# Patient Record
Sex: Male | Born: 1935
Health system: Southern US, Community
[De-identification: ages and names within clinical notes are randomized; demographics above are authoritative.]

## PROBLEM LIST (undated history)

## (undated) DIAGNOSIS — G4733 Obstructive sleep apnea (adult) (pediatric): Secondary | ICD-10-CM

## (undated) DIAGNOSIS — M792 Neuralgia and neuritis, unspecified: Secondary | ICD-10-CM

## (undated) DIAGNOSIS — K529 Noninfective gastroenteritis and colitis, unspecified: Secondary | ICD-10-CM

## (undated) DIAGNOSIS — G500 Trigeminal neuralgia: Secondary | ICD-10-CM

## (undated) DIAGNOSIS — N200 Calculus of kidney: Secondary | ICD-10-CM

## (undated) DIAGNOSIS — H409 Unspecified glaucoma: Secondary | ICD-10-CM

## (undated) DIAGNOSIS — M797 Fibromyalgia: Secondary | ICD-10-CM

## (undated) DIAGNOSIS — E785 Hyperlipidemia, unspecified: Secondary | ICD-10-CM

## (undated) DIAGNOSIS — Z9989 Dependence on other enabling machines and devices: Secondary | ICD-10-CM

## (undated) HISTORY — PX: HAND SURGERY: SHX662

## (undated) HISTORY — PX: OTHER SURGICAL HISTORY: SHX169

## (undated) HISTORY — DX: Hyperlipidemia, unspecified: E78.5

## (undated) HISTORY — DX: Unspecified glaucoma: H40.9

## (undated) HISTORY — PX: COLONOSCOPY: SHX174

## (undated) HISTORY — DX: Trigeminal neuralgia: G50.0

## (undated) HISTORY — DX: Neuralgia and neuritis, unspecified: M79.2

## (undated) HISTORY — DX: Dependence on other enabling machines and devices: Z99.89

## (undated) HISTORY — DX: Noninfective gastroenteritis and colitis, unspecified: K52.9

## (undated) HISTORY — DX: Fibromyalgia: M79.7

## (undated) HISTORY — DX: Calculus of kidney: N20.0

## (undated) HISTORY — DX: Obstructive sleep apnea (adult) (pediatric): G47.33

---

## 2005-06-17 ENCOUNTER — Ambulatory Visit: Payer: Self-pay | Admitting: Internal Medicine

## 2008-02-17 ENCOUNTER — Ambulatory Visit (HOSPITAL_COMMUNITY): Admission: RE | Admit: 2008-02-17 | Discharge: 2008-02-17 | Payer: Self-pay | Admitting: Urology

## 2008-04-02 ENCOUNTER — Ambulatory Visit: Admission: RE | Admit: 2008-04-02 | Discharge: 2008-04-02 | Payer: Self-pay | Admitting: Neurology

## 2011-01-03 NOTE — Procedures (Signed)
NAME:  JACLYN, CAREW               ACCOUNT NO.:  [id number]   MEDICAL RECORD NO.:  [id number]          PATIENT TYPE:  OUT   LOCATION:  SLEE                          FACILITY:  [facility]   PHYSICIAN:  Kofi A. Gerilyn Pilgrim, M.D. DATE OF BIRTH:  Nov 03, 1932   DATE OF PROCEDURE:  DATE OF DISCHARGE:  04/02/2008                             SLEEP DISORDER REPORT   POLYSOMNOGRAPHY REPORT   REFERRING PHYSICIAN:  Kofi A. Gerilyn Pilgrim, M.D.   INDICATION:  A 77 year old male who presents with snoring and insomnia.  He has been evaluated for obstructive sleep apnea syndrome.   MEDICATIONS:  Quinapril, Neurontin.   Epworth sleepiness scale 6.  BMI 24.   SLEEP STAGE SUMMARY:  The total recording time is 464 minutes.  Sleep  efficiency 66%.  Sleep latency 8.5 minutes.  REM latency 62 minutes.  Stage N1 9%, N2 62%, N3 15%, and the REM sleep 19.5%.   RESPIRATORY SUMMARY:  The baseline oxygen saturation is 99 with the  lowest oxygen saturation 94%, pHi 3.1.   LIMB MOVEMENT SUMMARY:  PLM index 3.7.   ELECTROCARDIOGRAM SUMMARY:  Average heart rate of 56 with no significant  dysrhythmias observed.   IMPRESSION:  Unremarkable nocturnal polysomnography.      Kofi A. Gerilyn Pilgrim, M.D.  Electronically Signed     KAD/MEDQ  D:  04/05/2008  T:  04/06/2008  Job:  [id number]

## 2014-04-11 ENCOUNTER — Ambulatory Visit: Payer: Self-pay | Admitting: Neurology

## 2014-04-19 ENCOUNTER — Telehealth: Payer: Self-pay | Admitting: Neurology

## 2014-04-19 NOTE — Telephone Encounter (Signed)
Pt cancelled appt and referring dr notified

## 2015-03-27 ENCOUNTER — Encounter: Payer: Self-pay | Admitting: *Deleted

## 2015-03-28 ENCOUNTER — Encounter: Payer: Self-pay | Admitting: Neurology

## 2015-03-28 ENCOUNTER — Ambulatory Visit (INDEPENDENT_AMBULATORY_CARE_PROVIDER_SITE_OTHER): Payer: Medicare Other | Admitting: Neurology

## 2015-03-28 VITALS — BP 149/82 | HR 66 | Ht 69.0 in | Wt 144.0 lb

## 2015-03-28 DIAGNOSIS — H547 Unspecified visual loss: Secondary | ICD-10-CM

## 2015-03-28 DIAGNOSIS — G500 Trigeminal neuralgia: Secondary | ICD-10-CM | POA: Diagnosis not present

## 2015-03-28 MED ORDER — OXCARBAZEPINE 150 MG PO TABS: ORAL_TABLET | ORAL | Status: DC

## 2015-03-28 NOTE — Progress Notes (Signed)
PATIENT: Richard Santiago DOB: 12-18-1932  Chief Complaint  Patient presents with  . Trigeminal Neuralgia    He is here with his wife, Richard Santiago.  He has been having his right-sided facial pain for at least ten years.  Says he has tried numberous medications and has underwent gamma knife.  Currenty, he is using ibuprofen and naproxen for pain relief.     HISTORICAL  Richard Santiago is a 81 years old right-handed male, accompanied by his wife, seen in refer by his primary care physician Dr. Sherril Croon,  I reviewed and summarized most recent office visit from Dr. 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 Gentry Roch, he had a complex ocular issues, exudative detachment and development of RAPD at some point between Nov 2014 and Mar 2015. Per prior notes the patient had a chorioretinitis, but no evidence of intraocular inflammation, exam showed superior choroidal effusion adjacent to subretinal fluid inferiorly.  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, was put on treatment for tuberculosis with 4 drug regimen, on repeat ophthalmology examination, he does great response to anti-TB treatment, with significant less serous retinal detachment, he has poor vision at baseline  He began to have right facial shooting pain since 2004, has been evaluated and treated by different neurologists in the past, develop side effect with different agent, gabapentin works some, but caused drowsiness, Lyrica, increased confusion, Vimpat, dizziness, carbamazepine, causing rash, for a while, he has to rely on narcotics, Lortab, hydrocodone  He was eventually referred to his first Gamma knife surgery by [facility] neurosurgeon Dr.  Brooke Pace in 2007, He responded very well, the benefit lasts for a couple years  He then began to have recurrent right facial pain again, with spreading of the territory, not only involving his right temporal frontal area, also began to involving right cheek, right upper and lower teeth, he had second, knife surgery by Dr. Brooke Pace in February 20 first 2013, with only short lasting benefit,  Over the past 2 years, he had variable degree of right facial pain, getting worse since 2015, 20% time of the day he experienced moderate to severe shooting pain, " like a hot iron poker to his face", triggered by cold, acidic food he had a significant weight loss, over the past 3 months, he has been taken Aleve 200 mg tablets 2 tablets every 4-5 hours, up to 10-12 tablets each day,  he is not on any neuropathic pain medications. Reported a history of rash with carbamazepine treatment in the past  REVIEW OF SYSTEMS: Full 14 system review of systems performed and notable only for constipation, loss of vision, snoring,   ALLERGIES: Allergies  Allergen Reactions  . Gabapentin Other (See Comments)    Fatigue, confusion  . Lyrica [Pregabalin] Other (See Comments)    Fatigue  . Quinapril Swelling    Fatigue  . Ciprofloxacin Rash  . Tegretol [Carbamazepine] Rash    HOME MEDICATIONS: Current Outpatient Prescriptions  Medication Sig Dispense Refill  . amitriptyline (ELAVIL) 10 MG tablet Take 10 mg by mouth at bedtime.    Marland Kitchen amLODipine (NORVASC) 10 MG tablet Take 10 mg by mouth daily. Take in addition to 10mg  tab - total 15mg  daily.    Marland Kitchen  amLODipine (NORVASC) 5 MG tablet Take 5 mg by mouth daily. Take in addition to 10mg  tablet - total 15mg  daily.    Marland Kitchen gabapentin (NEURONTIN) 600 MG tablet Take 600 mg by mouth. Take one tab three times daily and two tablets at bedtime.    Marland Kitchen latanoprost (XALATAN) 0.005 % ophthalmic solution daily.    . Multiple Vitamin (MULTIVITAMIN) tablet Take 1 tablet by mouth daily.     . naproxen sodium (ANAPROX) 220 MG tablet Take 220 mg by mouth 3 (three) times daily as needed.    . nystatin (MYCOSTATIN) 100000 UNIT/ML suspension Take by mouth 3 (three) times daily.    . timolol (TIMOPTIC) 0.5 % ophthalmic solution daily.     No current facility-administered medications for this visit.    PAST MEDICAL HISTORY: Past Medical History  Diagnosis Date  . Gastroenteritis   . OSA on CPAP   . Neuralgia   . Kidney stone   . Hyperlipemia   . Fibromyalgia   . Trigeminal neuralgia   . Glaucoma     PAST SURGICAL HISTORY: Past Surgical History  Procedure Laterality Date  . Hand surgery Right   . Colonoscopy    . Gamma knife      Dr. Angelyn Punt x 2    FAMILY HISTORY: Family History  Problem Relation Age of Onset  . Cancer Mother   . Heart attack Father     SOCIAL HISTORY:  Social History   Social History  . Marital Status: Married    Spouse Name: N/A  . Number of Children: 5  . Years of Education: 9   Occupational History  . Retired    Social History Main Topics  . Smoking status: Never Smoker   . Smokeless tobacco: Not on file  . Alcohol Use: No  . Drug Use: No  . Sexual Activity: Not on file   Other Topics Concern  . Not on file   Social History Narrative   Lives at home with his wife, Richard Santiago.    Right-handed.   No caffeine use.     PHYSICAL EXAM   Filed Vitals:   03/28/15 1431  BP: 149/82  Pulse: 66  Height: 5\' 9"  (1.753 m)  Weight: 144 lb (65.318 kg)    Not recorded      Body mass index is 21.26 kg/(m^2).  PHYSICAL EXAMNIATION:  Gen: NAD, conversant, well nourised, obese, well groomed                     Cardiovascular: Regular rate rhythm, no peripheral edema, warm, nontender. Eyes: Conjunctivae clear without exudates or hemorrhage Neck: Supple, no carotid bruise. Pulmonary: Clear to auscultation bilaterally   NEUROLOGICAL EXAM:  MENTAL STATUS: Speech:    Speech is normal; fluent and spontaneous with normal  comprehension.  Cognition:     Orientation to time, place and person     Normal recent and remote memory     Normal Attention span and concentration     Normal Language, naming, repeating,spontaneous speech     Fund of knowledge   CRANIAL NERVES: CN II: Visual fields are full to confrontation. Fundoscopic exam is normal with sharp discs and no vascular changes. Postsurgical change of irregular pupil bilaterally,. Visual acuity 20/400 bilaterally CN III, IV, VI: extraocular movement are normal. No ptosis. CN V: Facial sensation is intact to pinprick in all 3 divisions bilaterally. Corneal responses are intact.  CN VII: Face is symmetric with normal eye closure and smile. CN VIII:  Hearing is normal to rubbing fingers CN IX, X: Palate elevates symmetrically. Phonation is normal. CN XI: Head turning and shoulder shrug are intact CN XII: Tongue is midline with normal movements and no atrophy.  MOTOR: There is no pronator drift of out-stretched arms. Muscle bulk and tone are normal. Muscle strength is normal.  REFLEXES: Reflexes are 2+ and symmetric at the biceps, triceps, knees, and ankles. Plantar responses are flexor.  SENSORY: Intact to light touch, pinprick, position sense, and vibration sense are intact in fingers and toes, with exception of decreased light touch at right face V1, V2, V3 distribution.  COORDINATION: Rapid alternating movements and fine finger movements are intact. There is no dysmetria on finger-to-nose and heel-knee-shin.    GAIT/STANCE: Cautious, mildly unsteady Romberg is absent.  DIAGNOSTIC DATA (LABS, IMAGING, TESTING) - I reviewed patient records, labs, notes, testing and imaging myself where available.   ASSESSMENT AND PLAN  Richard Santiago is a 81 y.o. male   Recurrent right trigeminal neuralgia  Failed, knife surgery twice, most recent was February 2013  Could not tolerate Lyrica, Vimpat, less optimal control with gabapentin, could not tolerate  higher dose with side effect, allergic reaction to carbamazepine, rash,  Start low-dose Trileptal 150 mg half tablets twice a day, potential side effect including rash was explained to the patient, he is to call my office for rash  MRI of the brain with and without contrast History of ocular tuberculosis    Levert Feinstein, M.D. Ph.D.  [facility] [address], [address] [place], [place] [address] Ph: [phone] Fax: [phone]  CC: To  Dr. Sherril Croon,

## 2015-04-09 ENCOUNTER — Other Ambulatory Visit: Payer: Self-pay | Admitting: *Deleted

## 2015-04-09 ENCOUNTER — Other Ambulatory Visit: Payer: Self-pay | Admitting: Neurology

## 2015-04-09 DIAGNOSIS — G500 Trigeminal neuralgia: Secondary | ICD-10-CM

## 2015-04-10 ENCOUNTER — Ambulatory Visit
Admission: RE | Admit: 2015-04-10 | Discharge: 2015-04-10 | Disposition: A | Discharge status: Home / Self Care | Payer: PRIVATE HEALTH INSURANCE | Source: Ambulatory Visit | Attending: Neurology | Admitting: Neurology

## 2015-04-10 DIAGNOSIS — H547 Unspecified visual loss: Secondary | ICD-10-CM | POA: Diagnosis not present

## 2015-04-10 DIAGNOSIS — G500 Trigeminal neuralgia: Secondary | ICD-10-CM | POA: Diagnosis not present

## 2015-04-10 MED ORDER — GADOBENATE DIMEGLUMINE 529 MG/ML IV SOLN
7.0000 mL | Freq: Once | INTRAVENOUS | Status: AC | PRN
  Administered 2015-04-10: 6 mL via INTRAVENOUS

## 2015-04-12 ENCOUNTER — Telehealth: Payer: Self-pay | Admitting: *Deleted

## 2015-04-12 NOTE — Telephone Encounter (Signed)
-----   Message from Levert Feinstein, MD sent at 04/12/2015 12:33 PM EDT ----- Please call pt for no significant abnormality on MRI brain.

## 2015-04-12 NOTE — Telephone Encounter (Signed)
Spoke to patient - aware of results. 

## 2015-05-02 ENCOUNTER — Encounter: Payer: Self-pay | Admitting: Neurology

## 2015-05-02 ENCOUNTER — Ambulatory Visit (INDEPENDENT_AMBULATORY_CARE_PROVIDER_SITE_OTHER): Payer: Medicare Other | Admitting: Neurology

## 2015-05-02 VITALS — BP 125/73 | HR 58 | Ht 69.0 in | Wt 147.0 lb

## 2015-05-02 DIAGNOSIS — G500 Trigeminal neuralgia: Secondary | ICD-10-CM | POA: Diagnosis not present

## 2015-05-02 MED ORDER — NORTRIPTYLINE HCL 10 MG PO CAPS: ORAL_CAPSULE | ORAL | Status: DC

## 2015-05-02 MED ORDER — GABAPENTIN 600 MG PO TABS: 600.0000 mg | ORAL_TABLET | Freq: Three times a day (TID) | ORAL | Status: DC

## 2015-05-02 NOTE — Progress Notes (Signed)
Chief Complaint  Patient presents with  . Trigeminal Neuralgia    He is here with his wife, Richard Santiago. He has had some improvement in pain with Trileptal but he noticed a rash appear a few days after he started taking the medication.  . Vision difficulty    Says he has good and bad days with his vision difficulty. He was only able to read at 20/100 with his glasses today.      PATIENT: Richard Santiago DOB: [birth year]  Chief Complaint  Patient presents with  . Trigeminal Neuralgia    He is here with his wife, Richard Santiago. He has had some improvement in pain with Trileptal but he noticed a rash appear a few days after he started taking the medication.  . Vision difficulty    Says he has good and bad days with his vision difficulty. He was only able to read at 20/100 with his glasses today.     HISTORICAL  Richard Santiago is a 81 years old right-handed male, accompanied by his wife, seen in refer by his primary care physician Dr. Sherril Croon,  I reviewed and summarized most recent office visit from Dr. 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 Gentry Roch, he had a complex ocular issues, exudative detachment and development of RAPD at some point between Nov 2014 and Mar 2015. Per prior notes the patient had a chorioretinitis, but no evidence of intraocular inflammation, exam showed superior choroidal effusion adjacent to subretinal fluid inferiorly.  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, was put on treatment for tuberculosis with 4 drug regimen, on repeat ophthalmology examination, he does great response to anti-TB treatment, with significant less serous retinal detachment, he has poor vision at  baseline  He began to have right facial shooting pain since 2004, has been evaluated and treated by different neurologists in the past, develop side effect with different agent, gabapentin works some, but caused drowsiness, Lyrica, increased confusion, Vimpat, dizziness, carbamazepine, causing rash, for a while, he has to rely on narcotics, Lortab, hydrocodone  He was eventually referred to his first Gamma knife surgery by [facility] neurosurgeon Dr. Brooke Pace in 2007, He responded very well, the benefit lasts for a couple years  He then began to have recurrent right facial pain again, expanding of involved pain territory, not only involving his right temporal frontal area, also began to involving right cheek, right upper and lower teeth, he had second Gamma knife surgery by Dr. Brooke Pace in February 20 first 2013, with only short lasting benefit,  Over the past 2 years, he had variable degree of right facial pain, getting worse since 2015, 20% time of the day he experienced moderate to severe shooting pain, " like a hot iron poker to his face", triggered by cold, acidic food he had a significant weight loss, over the past 3 months, he has been taken Aleve 200 mg tablets 2 tablets every 4-5 hours, up to 10-12 tablets each day,  he is not on any neuropathic pain medications. Reported a history of rash with carbamazepine treatment in the past  UPDATE May 02 2015: He has stopped his gabapentin since last visit, he was taking up to 2400 mg daily, complains of  dizziness, sometimes sleep walking, Trileptal 150 mg half tablets twice a day was helpful especially at the beginning of the medication, but unfortunately, he has developed rash, he did not call office to report the rash, continue taking half tablets twice a day,  He has intermittent right lower jaw pain, difficulty eating, drinking,   I have personally reviewed MRI of brain August 2016:There are no acute findings. The trigeminal nerves appear  normal without any evidence of compression.  CSF space is generous around the frontal and parietal lobes. This could be idiopathic or due to subdural hygromas.  REVIEW OF SYSTEMS: Full 14 system review of systems performed and notable only for constipation, loss of vision, snoring,   ALLERGIES: Allergies  Allergen Reactions  . Gabapentin Other (See Comments)    Fatigue, confusion  . Lyrica [Pregabalin] Other (See Comments)    Fatigue  . Quinapril Swelling    Fatigue  . Ciprofloxacin Rash  . Tegretol [Carbamazepine] Rash    HOME MEDICATIONS: Current Outpatient Prescriptions  Medication Sig Dispense Refill  . amitriptyline (ELAVIL) 10 MG tablet Take 10 mg by mouth at bedtime.    Marland Kitchen amLODipine (NORVASC) 10 MG tablet Take 10 mg by mouth daily. Take in addition to  tab - total  daily.    Marland Kitchen amLODipine (NORVASC) 5 MG tablet Take 5 mg by mouth daily. Take in addition to  tablet - total  daily.    Marland Kitchen gabapentin (NEURONTIN) 600 MG tablet Take 600 mg by mouth. Take one tab three times daily and two tablets at bedtime.    Marland Kitchen latanoprost (XALATAN) 0.005 % ophthalmic solution daily.    . Multiple Vitamin (MULTIVITAMIN) tablet Take 1 tablet by mouth daily.    . naproxen sodium (ANAPROX) 220 MG tablet Take 220 mg by mouth 3 (three) times daily as needed.    . nystatin (MYCOSTATIN) 100000 UNIT/ML suspension Take by mouth 3 (three) times daily.    . OXcarbazepine (TRILEPTAL) 150 MG tablet 1/2 tab po bid 60 tablet 6  . timolol (TIMOPTIC) 0.5 % ophthalmic solution daily.     No current facility-administered medications for this visit.    PAST MEDICAL HISTORY: Past Medical History  Diagnosis Date  . Gastroenteritis   . OSA on CPAP   . Neuralgia   . Kidney stone   . Hyperlipemia   . Fibromyalgia   . Trigeminal neuralgia   . Glaucoma     PAST SURGICAL HISTORY: Past Surgical History  Procedure Laterality Date  . Hand surgery Right   . Colonoscopy    . Gamma knife      Dr.  Angelyn Punt x 2    FAMILY HISTORY: Family History  Problem Relation Age of Onset  . Cancer Mother   . Heart attack Father     SOCIAL HISTORY:  Social History   Social History  . Marital Status: Married    Spouse Name: N/A  . Number of Children: 5  . Years of Education: 9   Occupational History  . Retired    Social History Main Topics  . Smoking status: Never Smoker   . Smokeless tobacco: Not on file  . Alcohol Use: No  . Drug Use: No  . Sexual Activity: Not on file   Other Topics Concern  . Not on file   Social History Narrative   Lives at home with his wife, Richard Santiago.    Right-handed.   No caffeine use.     PHYSICAL EXAM   Filed Vitals:  05/02/15 1212  BP: 125/73  Pulse: 58  Height: 5\' 9"  (1.753 m)  Weight: 147 lb (66.679 kg)    Not recorded      Body mass index is 21.7 kg/(m^2).  PHYSICAL EXAMNIATION:  Gen: NAD, conversant, well nourised, obese, well groomed                     Cardiovascular: Regular rate rhythm, no peripheral edema, warm, nontender. Eyes: Conjunctivae clear without exudates or hemorrhage Neck: Supple, no carotid bruise. Pulmonary: Clear to auscultation bilaterally   NEUROLOGICAL EXAM:  MENTAL STATUS: Speech:    Speech is normal; fluent and spontaneous with normal comprehension.  Cognition:     Orientation to time, place and person     Normal recent and remote memory     Normal Attention span and concentration     Normal Language, naming, repeating,spontaneous speech     Fund of knowledge   CRANIAL NERVES: CN II: Visual fields are full to confrontation. Fundoscopic exam is normal with sharp discs and no vascular changes. Postsurgical change of irregular pupil bilaterally,. Visual acuity 20/400 bilaterally CN III, IV, VI: extraocular movement are normal. No ptosis. CN V: Facial sensation is intact to pinprick in all 3 divisions bilaterally. Corneal responses are intact.  CN VII: Face is symmetric with normal eye closure and  smile. CN VIII: Hearing is normal to rubbing fingers CN IX, X: Palate elevates symmetrically. Phonation is normal. CN XI: Head turning and shoulder shrug are intact CN XII: Tongue is midline with normal movements and no atrophy.  MOTOR: There is no pronator drift of out-stretched arms. Muscle bulk and tone are normal. Muscle strength is normal.  REFLEXES: Reflexes are 2+ and symmetric at the biceps, triceps, knees, and ankles. Plantar responses are flexor.  SENSORY: Intact to light touch, pinprick, position sense, and vibration sense are intact in fingers and toes, with exception of decreased light touch at right face V1, V2, V3 distribution.  COORDINATION: Rapid alternating movements and fine finger movements are intact. There is no dysmetria on finger-to-nose and heel-knee-shin.    GAIT/STANCE: Cautious, mildly unsteady Romberg is absent.  DIAGNOSTIC DATA (LABS, IMAGING, TESTING) - I reviewed patient records, labs, notes, testing and imaging myself where available.   ASSESSMENT AND PLAN  Richard Santiago is a 81 y.o. male   Recurrent right trigeminal neuralgia  Failed gamma knife surgery twice, most recent was February 2013  Could not tolerate Lyrica, Vimpat, less optimal control with gabapentin, higher dose caused side effect, such as sleepwalking, dizziness,   allergic reaction to Tegretol, Trileptal  We will restart gabapentin 600 mg tablet, gradually titrating up to 2400 mg daily  Nortriptyline 10 mg, titrating to 20 mg every night   History of ocular tuberculosis    Richard Santiago, M.D. Ph.D.  [facility] [address], [address] [place], [place] [address] Ph: [phone] Fax: [phone]  CC: To  Dr. Sherril Croon,

## 2015-05-23 ENCOUNTER — Telehealth: Payer: Self-pay | Admitting: *Deleted

## 2015-05-23 ENCOUNTER — Ambulatory Visit: Payer: Medicare Other | Admitting: Neurology

## 2015-05-23 NOTE — Telephone Encounter (Signed)
No showed follow up appt. 

## 2015-05-24 ENCOUNTER — Encounter: Payer: Self-pay | Admitting: Neurology

## 2015-06-11 ENCOUNTER — Ambulatory Visit (INDEPENDENT_AMBULATORY_CARE_PROVIDER_SITE_OTHER): Payer: Medicare Other | Admitting: Neurology

## 2015-06-11 ENCOUNTER — Encounter: Payer: Self-pay | Admitting: Neurology

## 2015-06-11 VITALS — BP 108/64 | HR 63 | Ht 69.0 in | Wt 148.0 lb

## 2015-06-11 DIAGNOSIS — G500 Trigeminal neuralgia: Secondary | ICD-10-CM

## 2015-06-11 MED ORDER — FENTANYL 25 MCG/HR TD PT72: 25.0000 ug | MEDICATED_PATCH | TRANSDERMAL | Status: DC

## 2015-06-11 NOTE — Progress Notes (Signed)
Chief Complaint  Patient presents with  . Trigeminal Neuralgia    He is here with his wife, Richard Santiago. His intermittent right-sided facial pain has worsened.  He is currently taking nortriptyline  qhs and gabapentin , one capsule four times daily.  His wife feels the gabapentin is making him too sleepy and confused.      PATIENT: Richard Santiago DOB: 03-08-1933  Chief Complaint  Patient presents with  . Trigeminal Neuralgia    He is here with his wife, Richard Santiago. His intermittent right-sided facial pain has worsened.  He is currently taking nortriptyline  qhs and gabapentin , one capsule four times daily.  His wife feels the gabapentin is making him too sleepy and confused.     HISTORICAL  Richard Santiago is a 82 years old right-handed male, accompanied by his wife, seen in refer by his primary care physician Dr. Sherril Croon,  I reviewed and summarized most recent office visit from Dr. 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 Gentry Roch, he had a complex ocular issues, exudative detachment and development of RAPD at some point between Nov 2014 and Mar 2015. Per prior notes the patient had a chorioretinitis, but no evidence of intraocular inflammation, exam showed superior choroidal effusion adjacent to subretinal fluid inferiorly.  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, was put on treatment for tuberculosis with 4 drug regimen, on repeat ophthalmology examination, he does great response to anti-TB treatment, with significant less serous retinal detachment, he has poor vision at baseline  He began to have right facial shooting pain since 2004, has been evaluated and treated by different  neurologists in the past, develop side effect with different agent, gabapentin works some, but caused drowsiness, Lyrica, increased confusion, Vimpat, dizziness, carbamazepine, trileptal causing rash, for a while, he has to rely on narcotics, Lortab, hydrocodone  He was eventually referred to his first Gamma knife surgery by [facility] neurosurgeon Dr. Brooke Pace in 2007, He responded very well, the benefit lasts for a couple years  He then began to have recurrent right facial pain again, expanding of involved pain territory, not only involving his right temporal frontal area, also began to involving right cheek, right upper and lower teeth, he had second Gamma knife surgery by Dr. Brooke Pace in February 20 first 2013, with only short lasting benefit,  Over the past 2 years, he had variable degree of right facial pain, getting worse since 2015, 20% time of the day he experienced moderate to severe shooting pain, " like a hot iron poker to his face", triggered by cold, acidic food he had a significant weight loss, over the past 3 months, he has been taken Aleve 200 mg tablets 2 tablets every 4-5 hours, up to 10-12 tablets each day,  he is not on any neuropathic pain medications. Reported a history of rash with carbamazepine treatment in the past  UPDATE May 02 2015: He has stopped his gabapentin since last visit, he was taking up to 2400 mg daily, complains of dizziness, sometimes sleep walking, Trileptal 150 mg half tablets twice a day was helpful especially at the beginning of the medication, but unfortunately, he has developed rash, he did not call office to report the  rash, continue taking half tablets twice a day,  He has intermittent right lower jaw pain, difficulty eating, drinking,   I have personally reviewed MRI of brain August 2016:There are no acute findings. The trigeminal nerves appear normal without any evidence of compression.  CSF space is generous around the frontal and parietal  lobes. This could be idiopathic or due to subdural hygromas.  UPDATE Jun 11 2015: He continue have moderate to severe right trigeminal pain, involving right lower jaw, radiating to the torsed right temporal region, he has been taking gabapentin 600 mg 4 times a day, complains of dizziness, sleepiness with higher dose, suboptimal control with current gabapentin, he was able to tolerate nortriptyline 20 mg every night, seems to help him sleep better, no significant side effect.  REVIEW OF SYSTEMS: Full 14 system review of systems performed and notable only for constipation, loss of vision, snoring,   ALLERGIES: Allergies  Allergen Reactions  . Gabapentin Other (See Comments)    Fatigue, confusion  . Lyrica [Pregabalin] Other (See Comments)    Fatigue  . Quinapril Swelling    Fatigue  . Ciprofloxacin Rash  . Tegretol [Carbamazepine] Rash  . Trileptal [Oxcarbazepine] Rash    HOME MEDICATIONS: Current Outpatient Prescriptions  Medication Sig Dispense Refill  . amLODipine (NORVASC) 10 MG tablet Take 10 mg by mouth daily. Take in addition to  tab - total  daily.    Marland Kitchen amLODipine (NORVASC) 5 MG tablet Take 5 mg by mouth daily. Take in addition to  tablet - total  daily.    Marland Kitchen gabapentin (NEURONTIN) 600 MG tablet Take 1 tablet (600 mg total) by mouth 3 (three) times daily. Take one tab three times daily and two tablets at bedtime. 150 tablet 11  . latanoprost (XALATAN) 0.005 % ophthalmic solution daily.    . Multiple Vitamin (MULTIVITAMIN) tablet Take 1 tablet by mouth daily.    . naproxen sodium (ANAPROX) 220 MG tablet Take 220 mg by mouth 3 (three) times daily as needed.    . nortriptyline (PAMELOR) 10 MG capsule One po qhs xone week, then 2 tabs po qhs 60 capsule 6  . nystatin (MYCOSTATIN) 100000 UNIT/ML suspension Take by mouth 3 (three) times daily.    . timolol (TIMOPTIC) 0.5 % ophthalmic solution daily.     No current facility-administered medications for this visit.     PAST MEDICAL HISTORY: Past Medical History  Diagnosis Date  . Gastroenteritis   . OSA on CPAP   . Neuralgia   . Kidney stone   . Hyperlipemia   . Fibromyalgia   . Trigeminal neuralgia   . Glaucoma     PAST SURGICAL HISTORY: Past Surgical History  Procedure Laterality Date  . Hand surgery Right   . Colonoscopy    . Gamma knife      Dr. Angelyn Punt x 2    FAMILY HISTORY: Family History  Problem Relation Age of Onset  . Cancer Mother   . Heart attack Father     SOCIAL HISTORY:  Social History   Social History  . Marital Status: Married    Spouse Name: N/A  . Number of Children: 5  . Years of Education: 9   Occupational History  . Retired    Social History Main Topics  . Smoking status: Never Smoker   . Smokeless tobacco: Not on file  . Alcohol Use: No  . Drug Use: No  . Sexual Activity: Not on file   Other Topics Concern  . Not  on file   Social History Narrative   Lives at home with his wife, Richard AasDoris.    Right-handed.   No caffeine use.     PHYSICAL EXAM   Filed Vitals:   06/11/15 1602  BP: 108/64  Pulse: 63  Height: 5\' 9"  (1.753 m)  Weight: 148 lb (67.132 kg)    Not recorded      Body mass index is 21.85 kg/(m^2).  PHYSICAL EXAMNIATION:  Gen: NAD, conversant, well nourised, obese, well groomed                     Cardiovascular: Regular rate rhythm, no peripheral edema, warm, nontender. Eyes: Conjunctivae clear without exudates or hemorrhage Neck: Supple, no carotid bruise. Pulmonary: Clear to auscultation bilaterally   NEUROLOGICAL EXAM:  MENTAL STATUS: Speech:    Speech is normal; fluent and spontaneous with normal comprehension.  Cognition:     Orientation to time, place and person     Normal recent and remote memory     Normal Attention span and concentration     Normal Language, naming, repeating,spontaneous speech     Fund of knowledge   CRANIAL NERVES: CN II: Visual fields are full to confrontation. Fundoscopic exam is  normal with sharp discs and no vascular changes. Postsurgical change of irregular pupil bilaterally,. Visual acuity 20/400 bilaterally CN III, IV, VI: extraocular movement are normal. No ptosis. CN V: Facial sensation is intact to pinprick in all 3 divisions bilaterally. Corneal responses are intact.  CN VII: Face is symmetric with normal eye closure and smile. CN VIII: Hearing is normal to rubbing fingers CN IX, X: Palate elevates symmetrically. Phonation is normal. CN XI: Head turning and shoulder shrug are intact CN XII: Tongue is midline with normal movements and no atrophy.  MOTOR: There is no pronator drift of out-stretched arms. Muscle bulk and tone are normal. Muscle strength is normal.  REFLEXES: Reflexes are 2+ and symmetric at the biceps, triceps, knees, and ankles. Plantar responses are flexor.  SENSORY: Intact to light touch, pinprick, position sense, and vibration sense are intact in fingers and toes, with exception of decreased light touch at right face V1, V2, V3 distribution.  COORDINATION: Rapid alternating movements and fine finger movements are intact. There is no dysmetria on finger-to-nose and heel-knee-shin.    GAIT/STANCE: Cautious, mildly unsteady Romberg is absent.  DIAGNOSTIC DATA (LABS, IMAGING, TESTING) - I reviewed patient records, labs, notes, testing and imaging myself where available.   ASSESSMENT AND PLAN  Lannette Donathlvin E Bialy is a 81 y.o. male   Recurrent right trigeminal neuralgia  Failed gamma knife surgery twice, most recent was February 2013  Could not tolerate Lyrica, Vimpat, less optimal control with gabapentin, higher dose caused side effect, such as sleepwalking, dizziness, is currently taking 600  mg 4 times a day  allergic reaction to Tegretol, Trileptal  We will restart gabapentin 600 mg tablet, gradually titrating up to 2400 mg daily  Keep Nortriptyline 20 mg every night  Add on fentanyl patch 25 g every 3 days   History of ocular  tuberculosis    Levert FeinsteinYijun Jawaun Celmer, M.D. Ph.D.  [facility] [address], [address] [place], [place] [address] Ph: [phone] Fax: [phone]  CC: To  Dr. Sherril CroonVyas,

## 2015-07-30 ENCOUNTER — Ambulatory Visit (INDEPENDENT_AMBULATORY_CARE_PROVIDER_SITE_OTHER): Payer: Medicare Other | Admitting: Neurology

## 2015-07-30 ENCOUNTER — Encounter: Payer: Self-pay | Admitting: Neurology

## 2015-07-30 VITALS — BP 150/79 | HR 71 | Ht 69.0 in | Wt 141.0 lb

## 2015-07-30 DIAGNOSIS — G500 Trigeminal neuralgia: Secondary | ICD-10-CM | POA: Diagnosis not present

## 2015-07-30 MED ORDER — GABAPENTIN 600 MG PO TABS: 600.0000 mg | ORAL_TABLET | Freq: Every day | ORAL | Status: AC

## 2015-07-30 NOTE — Progress Notes (Signed)
Chief Complaint  Patient presents with  . Trigeminal Neuralgia    He is here with wife, Richard AasDoris and daughter, Richard Santiago. His pain is much worse despite taking all his medications as prescribed.      PATIENT: Richard Santiago DOB: [birth year]  Chief Complaint  Patient presents with  . Trigeminal Neuralgia    He is here with wife, Richard AasDoris and daughter, Richard Santiago. His pain is much worse despite taking all his medications as prescribed.     HISTORICAL  Richard Santiago is a 81 years old right-handed male, accompanied by his wife, seen in refer by his primary care physician Dr. Sherril CroonVyas,  I reviewed and summarized most recent office visit from Dr. 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 Gentry Rochimothy Martin, he had a complex ocular issues, exudative detachment and development of RAPD at some point between Nov 2014 and Mar 2015. Per prior notes the patient had a chorioretinitis, but no evidence of intraocular inflammation, exam showed superior choroidal effusion adjacent to subretinal fluid inferiorly.  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, was put on treatment for tuberculosis with 4 drug regimen, on repeat ophthalmology examination, he does great response to anti-TB treatment, with significant less serous retinal detachment, he has poor vision at baseline  He began to have right facial shooting pain since 2004, has been evaluated and treated by different neurologists in the past, develop side effect with different agent, gabapentin works some, but caused drowsiness, Lyrica, increased confusion, Vimpat, dizziness, carbamazepine, trileptal causing rash, for a while, he has to rely on narcotics, Lortab,  hydrocodone  He was eventually referred to his [facility] Gamma [facility] Dr. Brooke PaceStephen Tatter in 2007, He responded very well, the benefit lasts for a couple years  He then began to have recurrent right facial pain again, expanding of involved pain territory, not only involving his right temporal frontal area, also began to involving right cheek, right upper and lower teeth, he had second Gamma knife surgery by Dr. Brooke PaceStephen Tatter in February 20 first 2013, with only short lasting benefit,  Over the past 2 years, he had variable degree of right facial pain, getting worse since 2015, 20% time of the day he experienced moderate to severe shooting pain, " like a hot iron poker to his face", triggered by cold, acidic food he had a significant weight loss, over the past 3 months, he has been taken Aleve 200 mg tablets 2 tablets every 4-5 hours, up to 10-12 tablets each day,  he is not on any neuropathic pain medications. Reported a history of rash with carbamazepine treatment in the past  UPDATE May 02 2015: He has stopped his gabapentin since last visit, he was taking up to 2400 mg daily, complains of dizziness, sometimes sleep walking, Trileptal 150 mg half tablets twice a day was helpful especially at the beginning of the medication, but unfortunately, he has developed rash, he did not call office to report the rash, continue taking half tablets twice a day,  He has intermittent right lower jaw pain, difficulty eating, drinking,   I have personally reviewed MRI of brain August 2016:There are no acute findings. The trigeminal nerves appear normal without any evidence of  compression.  CSF space is generous around the frontal and parietal lobes. This could be idiopathic or due to subdural hygromas.  UPDATE Jun 11 2015: He continue have moderate to severe right trigeminal pain, involving right lower jaw, radiating to the torsed right temporal region, he has been taking gabapentin 600  mg 4 times a day, complains of dizziness, sleepiness with higher dose, suboptimal control with current gabapentin, he was able to tolerate nortriptyline 20 mg every night, seems to help him sleep better, no significant side effect.  UPDATE Jul 30 2015:  He has tried fentanyl patch 25 g, along with gabapentin 600 mg 4 times a day, nortriptyline 20 mg every night, did not help his symptoms, previously has tried Percocet could not tolerate it due to side effect, he has frequent severe right facial pain mainly involving right V2 branches   REVIEW OF SYSTEMS: Full 14 system review of systems performed and notable only for constipation, loss of vision, snoring,   ALLERGIES: Allergies  Allergen Reactions  . Gabapentin Other (See Comments)    Fatigue, confusion  . Lyrica [Pregabalin] Other (See Comments)    Fatigue  . Quinapril Swelling    Fatigue  . Ciprofloxacin Rash  . Tegretol [Carbamazepine] Rash  . Trileptal [Oxcarbazepine] Rash    HOME MEDICATIONS: Current Outpatient Prescriptions  Medication Sig Dispense Refill  . amLODipine (NORVASC) 10 MG tablet Take 10 mg by mouth daily. Take in addition to  tab - total  daily.    Marland Kitchen amLODipine (NORVASC) 5 MG tablet Take 5 mg by mouth daily. Take in addition to  tablet - total  daily.    . fentaNYL (DURAGESIC) 25 MCG/HR patch Place 1 patch (25 mcg total) onto the skin every 3 (three) days. 11 patch 0  . gabapentin (NEURONTIN) 600 MG tablet Take 1 tablet (600 mg total) by mouth 3 (three) times daily. Take one tab three times daily and two tablets at bedtime. 150 tablet 11  . latanoprost (XALATAN) 0.005 % ophthalmic solution daily.    . Multiple Vitamin (MULTIVITAMIN) tablet Take 1 tablet by mouth daily.    . naproxen sodium (ANAPROX) 220 MG tablet Take 220 mg by mouth 3 (three) times daily as needed.    . nortriptyline (PAMELOR) 10 MG capsule One po qhs xone week, then 2 tabs po qhs 60 capsule 6  . nystatin (MYCOSTATIN) 100000  UNIT/ML suspension Take by mouth 3 (three) times daily.    . timolol (TIMOPTIC) 0.5 % ophthalmic solution daily.     No current facility-administered medications for this visit.    PAST MEDICAL HISTORY: Past Medical History  Diagnosis Date  . Gastroenteritis   . OSA on CPAP   . Neuralgia   . Kidney stone   . Hyperlipemia   . Fibromyalgia   . Trigeminal neuralgia   . Glaucoma     PAST SURGICAL HISTORY: Past Surgical History  Procedure Laterality Date  . Hand surgery Right   . Colonoscopy    . Gamma knife      Dr. Angelyn Punt x 2    FAMILY HISTORY: Family History  Problem Relation Age of Onset  . Cancer Mother   . Heart attack Father     SOCIAL HISTORY:  Social History   Social History  . Marital Status: Married    Spouse Name: N/A  . Number of Children: 5  . Years of Education: 9   Occupational History  . Retired    Social History Main Topics  .  Smoking status: Never Smoker   . Smokeless tobacco: Not on file  . Alcohol Use: No  . Drug Use: No  . Sexual Activity: Not on file   Other Topics Concern  . Not on file   Social History Narrative   Lives at home with his wife, Richard Santiago.    Right-handed.   No caffeine use.     PHYSICAL EXAM   Filed Vitals:   07/30/15 1657  BP: 150/79  Pulse: 71  Height:  (1.753 m)  Weight: 141 lb (63.957 kg)    Not recorded      Body mass index is 20.81 kg/(m^2).  PHYSICAL EXAMNIATION:  Gen: NAD, conversant, well nourised, obese, well groomed                     Cardiovascular: Regular rate rhythm, no peripheral edema, warm, nontender. Eyes: Conjunctivae clear without exudates or hemorrhage Neck: Supple, no carotid bruise. Pulmonary: Clear to auscultation bilaterally   NEUROLOGICAL EXAM:  MENTAL STATUS: Speech:    Speech is normal; fluent and spontaneous with normal comprehension.  Cognition:     Orientation to time, place and person     Normal recent and remote memory     Normal Attention span and  concentration     Normal Language, naming, repeating,spontaneous speech     Fund of knowledge   CRANIAL NERVES: CN II: Visual fields are full to confrontation. Fundoscopic exam is normal with sharp discs and no vascular changes. Postsurgical change of irregular pupil bilaterally,. Visual acuity 20/400 bilaterally CN III, IV, VI: extraocular movement are normal. No ptosis. CN V: Facial sensation is intact to pinprick in all 3 divisions bilaterally. Corneal responses are intact.  CN VII: Face is symmetric with normal eye closure and smile. CN VIII: Hearing is normal to rubbing fingers CN IX, X: Palate elevates symmetrically. Phonation is normal. CN XI: Head turning and shoulder shrug are intact CN XII: Tongue is midline with normal movements and no atrophy.  MOTOR: There is no pronator drift of out-stretched arms. Muscle bulk and tone are normal. Muscle strength is normal.  REFLEXES: Reflexes are 2+ and symmetric at the biceps, triceps, knees, and ankles. Plantar responses are flexor.  SENSORY: Intact to light touch, pinprick, position sense, and vibration sense are intact in fingers and toes, with exception of decreased light touch at right face V1, V2, V3 distribution.  COORDINATION: Rapid alternating movements and fine finger movements are intact. There is no dysmetria on finger-to-nose and heel-knee-shin.    GAIT/STANCE: Cautious, mildly unsteady Romberg is absent.  DIAGNOSTIC DATA (LABS, IMAGING, TESTING) - I reviewed patient records, labs, notes, testing and imaging myself where available.   ASSESSMENT AND PLAN  Richard Santiago is a 81 y.o. male   Recurrent right trigeminal neuralgia  Failed gamma knife surgery twice, most recent was February 2013  Could not tolerate Lyrica, Vimpat, less optimal control with gabapentin, higher dose caused side effect, such as sleepwalking, dizziness, could not tolerate ox oxycodone in the past due to side effect , could not tolerate  fentanyl patch  allergic reaction to Tegretol, Trileptal  We continue gabapentin 600 mg tablet, titrating up to 3000 mg daily   Refer his to neurosurgeon Dr. Brooke Pace  History of ocular tuberculosis    Levert Feinstein, M.D. Ph.D.  [facility] [address] [place], [place] [address] Ph: [phone] Fax: [phone]  CC: To  Dr. Sherril Croon,

## 2015-10-14 ENCOUNTER — Encounter (HOSPITAL_COMMUNITY): Payer: Self-pay | Admitting: *Deleted

## 2015-10-14 ENCOUNTER — Emergency Department (HOSPITAL_COMMUNITY): Payer: Medicare PPO

## 2015-10-14 ENCOUNTER — Inpatient Hospital Stay (HOSPITAL_COMMUNITY)
Admission: EM | Admit: 2015-10-14 | Discharge: 2015-10-19 | DRG: 872 | Disposition: A | Discharge status: Home Health | Payer: Medicare PPO | Attending: Family Medicine | Admitting: Family Medicine

## 2015-10-14 DIAGNOSIS — Z79899 Other long term (current) drug therapy: Secondary | ICD-10-CM

## 2015-10-14 DIAGNOSIS — Z809 Family history of malignant neoplasm, unspecified: Secondary | ICD-10-CM | POA: Diagnosis not present

## 2015-10-14 DIAGNOSIS — I129 Hypertensive chronic kidney disease with stage 1 through stage 4 chronic kidney disease, or unspecified chronic kidney disease: Secondary | ICD-10-CM | POA: Diagnosis present

## 2015-10-14 DIAGNOSIS — N189 Chronic kidney disease, unspecified: Secondary | ICD-10-CM | POA: Diagnosis present

## 2015-10-14 DIAGNOSIS — R319 Hematuria, unspecified: Secondary | ICD-10-CM | POA: Insufficient documentation

## 2015-10-14 DIAGNOSIS — E785 Hyperlipidemia, unspecified: Secondary | ICD-10-CM | POA: Diagnosis present

## 2015-10-14 DIAGNOSIS — N10 Acute tubulo-interstitial nephritis: Secondary | ICD-10-CM

## 2015-10-14 DIAGNOSIS — G500 Trigeminal neuralgia: Secondary | ICD-10-CM | POA: Diagnosis present

## 2015-10-14 DIAGNOSIS — M797 Fibromyalgia: Secondary | ICD-10-CM | POA: Diagnosis present

## 2015-10-14 DIAGNOSIS — N179 Acute kidney failure, unspecified: Secondary | ICD-10-CM | POA: Diagnosis not present

## 2015-10-14 DIAGNOSIS — A419 Sepsis, unspecified organism: Principal | ICD-10-CM | POA: Diagnosis present

## 2015-10-14 DIAGNOSIS — A1853 Tuberculous chorioretinitis: Secondary | ICD-10-CM | POA: Diagnosis present

## 2015-10-14 DIAGNOSIS — Z8249 Family history of ischemic heart disease and other diseases of the circulatory system: Secondary | ICD-10-CM | POA: Diagnosis not present

## 2015-10-14 DIAGNOSIS — N136 Pyonephrosis: Secondary | ICD-10-CM | POA: Diagnosis present

## 2015-10-14 DIAGNOSIS — H409 Unspecified glaucoma: Secondary | ICD-10-CM | POA: Diagnosis present

## 2015-10-14 DIAGNOSIS — B9620 Unspecified Escherichia coli [E. coli] as the cause of diseases classified elsewhere: Secondary | ICD-10-CM | POA: Diagnosis present

## 2015-10-14 DIAGNOSIS — A1850 Tuberculosis of eye, unspecified: Secondary | ICD-10-CM | POA: Diagnosis present

## 2015-10-14 DIAGNOSIS — Z87442 Personal history of urinary calculi: Secondary | ICD-10-CM | POA: Diagnosis not present

## 2015-10-14 DIAGNOSIS — A4151 Sepsis due to Escherichia coli [E. coli]: Secondary | ICD-10-CM | POA: Diagnosis not present

## 2015-10-14 DIAGNOSIS — N1330 Unspecified hydronephrosis: Secondary | ICD-10-CM | POA: Diagnosis not present

## 2015-10-14 DIAGNOSIS — N400 Enlarged prostate without lower urinary tract symptoms: Secondary | ICD-10-CM | POA: Diagnosis present

## 2015-10-14 DIAGNOSIS — R509 Fever, unspecified: Secondary | ICD-10-CM | POA: Diagnosis not present

## 2015-10-14 DIAGNOSIS — G4733 Obstructive sleep apnea (adult) (pediatric): Secondary | ICD-10-CM | POA: Diagnosis present

## 2015-10-14 DIAGNOSIS — N12 Tubulo-interstitial nephritis, not specified as acute or chronic: Secondary | ICD-10-CM | POA: Diagnosis present

## 2015-10-14 LAB — URINALYSIS, ROUTINE W REFLEX MICROSCOPIC
BILIRUBIN URINE: NEGATIVE
Glucose, UA: NEGATIVE mg/dL
Ketones, ur: NEGATIVE mg/dL
NITRITE: NEGATIVE
Protein, ur: 100 mg/dL — AB
SPECIFIC GRAVITY, URINE: 1.02 (ref 1.005–1.030)
pH: 6 (ref 5.0–8.0)

## 2015-10-14 LAB — COMPREHENSIVE METABOLIC PANEL
ALBUMIN: 3.2 g/dL — AB (ref 3.5–5.0)
ALK PHOS: 91 U/L (ref 38–126)
ALT: 36 U/L (ref 17–63)
ANION GAP: 8 (ref 5–15)
AST: 37 U/L (ref 15–41)
BUN: 26 mg/dL — ABNORMAL HIGH (ref 6–20)
CALCIUM: 8.5 mg/dL — AB (ref 8.9–10.3)
CHLORIDE: 103 mmol/L (ref 101–111)
CO2: 28 mmol/L (ref 22–32)
Creatinine, Ser: 1.72 mg/dL — ABNORMAL HIGH (ref 0.61–1.24)
GFR calc non Af Amer: 36 mL/min — ABNORMAL LOW (ref >60)
GFR, EST AFRICAN AMERICAN: 41 mL/min — AB (ref >60)
GLUCOSE: 128 mg/dL — AB (ref 65–99)
POTASSIUM: 3.8 mmol/L (ref 3.5–5.1)
SODIUM: 139 mmol/L (ref 135–145)
Total Bilirubin: 1.5 mg/dL — ABNORMAL HIGH (ref 0.3–1.2)
Total Protein: 6.5 g/dL (ref 6.5–8.1)

## 2015-10-14 LAB — CBC WITH DIFFERENTIAL/PLATELET
BASOS PCT: 0 %
Basophils Absolute: 0 10*3/uL (ref 0.0–0.1)
EOS ABS: 0 10*3/uL (ref 0.0–0.7)
EOS PCT: 0 %
HCT: 32.3 % — ABNORMAL LOW (ref 39.0–52.0)
HEMOGLOBIN: 10.6 g/dL — AB (ref 13.0–17.0)
LYMPHS ABS: 0.5 10*3/uL — AB (ref 0.7–4.0)
Lymphocytes Relative: 6 %
MCH: 29.2 pg (ref 26.0–34.0)
MCHC: 32.8 g/dL (ref 30.0–36.0)
MCV: 89 fL (ref 78.0–100.0)
MONOS PCT: 13 %
Monocytes Absolute: 1 10*3/uL (ref 0.1–1.0)
NEUTROS PCT: 81 %
Neutro Abs: 6.2 10*3/uL (ref 1.7–7.7)
PLATELETS: 160 10*3/uL (ref 150–400)
RBC: 3.63 MIL/uL — ABNORMAL LOW (ref 4.22–5.81)
RDW: 12.1 % (ref 11.5–15.5)
WBC: 7.7 10*3/uL (ref 4.0–10.5)

## 2015-10-14 LAB — URINE MICROSCOPIC-ADD ON: SQUAMOUS EPITHELIAL / LPF: NONE SEEN

## 2015-10-14 LAB — POC OCCULT BLOOD, ED: FECAL OCCULT BLD: NEGATIVE

## 2015-10-14 LAB — INFLUENZA PANEL BY PCR (TYPE A & B)
H1N1FLUPCR: NOT DETECTED
INFLBPCR: NEGATIVE
Influenza A By PCR: NEGATIVE

## 2015-10-14 LAB — I-STAT CG4 LACTIC ACID, ED: Lactic Acid, Venous: 0.8 mmol/L (ref 0.5–2.0)

## 2015-10-14 LAB — TSH: TSH: 2.336 u[IU]/mL (ref 0.350–4.500)

## 2015-10-14 MED ORDER — DEXTROSE 5 % IV SOLN
1.0000 g | Freq: Once | INTRAVENOUS | Status: AC
  Administered 2015-10-14: 1 g via INTRAVENOUS
  Filled 2015-10-14: qty 10

## 2015-10-14 MED ORDER — ACETAMINOPHEN 325 MG PO TABS
650.0000 mg | ORAL_TABLET | Freq: Once | ORAL | Status: AC
Start: 2015-10-14 — End: 2015-10-14
  Administered 2015-10-14: 650 mg via ORAL
  Filled 2015-10-14: qty 2

## 2015-10-14 MED ORDER — DEXTROSE 5 % IV SOLN
1.0000 g | INTRAVENOUS | Status: DC
  Filled 2015-10-14 (×2): qty 10

## 2015-10-14 MED ORDER — SODIUM CHLORIDE 0.9% FLUSH
3.0000 mL | Freq: Two times a day (BID) | INTRAVENOUS | Status: DC
  Administered 2015-10-14 – 2015-10-19 (×5): 3 mL via INTRAVENOUS

## 2015-10-14 MED ORDER — LATANOPROST 0.005 % OP SOLN
1.0000 [drp] | Freq: Every day | OPHTHALMIC | Status: DC
  Administered 2015-10-16 – 2015-10-18 (×3): 1 [drp] via OPHTHALMIC
  Filled 2015-10-14: qty 2.5

## 2015-10-14 MED ORDER — ACETAMINOPHEN 325 MG PO TABS
650.0000 mg | ORAL_TABLET | Freq: Four times a day (QID) | ORAL | Status: DC | PRN
  Administered 2015-10-14 – 2015-10-17 (×6): 650 mg via ORAL
  Filled 2015-10-14 (×7): qty 2

## 2015-10-14 MED ORDER — SODIUM CHLORIDE 0.9 % IV BOLUS (SEPSIS)
1000.0000 mL | Freq: Once | INTRAVENOUS | Status: AC
  Administered 2015-10-14: 1000 mL via INTRAVENOUS

## 2015-10-14 MED ORDER — DEXTROSE-NACL 5-0.9 % IV SOLN
INTRAVENOUS | Status: DC
  Administered 2015-10-14 – 2015-10-15 (×2): via INTRAVENOUS

## 2015-10-14 MED ORDER — ACETAMINOPHEN 650 MG RE SUPP: 650.0000 mg | Freq: Four times a day (QID) | RECTAL | Status: DC | PRN

## 2015-10-14 MED ORDER — DEXTROSE 5 % IV SOLN: 1.0000 g | Freq: Once | INTRAVENOUS | Status: DC

## 2015-10-14 MED ORDER — HEPARIN SODIUM (PORCINE) 5000 UNIT/ML IJ SOLN
5000.0000 [IU] | Freq: Three times a day (TID) | INTRAMUSCULAR | Status: DC
  Administered 2015-10-15 – 2015-10-19 (×14): 5000 [IU] via SUBCUTANEOUS
  Filled 2015-10-14 (×14): qty 1

## 2015-10-14 MED ORDER — TIMOLOL MALEATE 0.5 % OP SOLN
1.0000 [drp] | Freq: Every day | OPHTHALMIC | Status: DC
  Administered 2015-10-16 – 2015-10-19 (×4): 1 [drp] via OPHTHALMIC
  Filled 2015-10-14 (×2): qty 5

## 2015-10-14 MED ORDER — LORAZEPAM 0.5 MG PO TABS
0.5000 mg | ORAL_TABLET | Freq: Two times a day (BID) | ORAL | Status: DC
  Administered 2015-10-15 – 2015-10-19 (×9): 0.5 mg via ORAL
  Filled 2015-10-14 (×9): qty 1

## 2015-10-14 NOTE — ED Notes (Signed)
Pts wife states pt was shaking last night and this morning, possibly having chills. Wife states that pts urine is very strong with increased urinary frequency.

## 2015-10-14 NOTE — ED Notes (Signed)
Rancour informed of 103.7 rectal temp

## 2015-10-14 NOTE — ED Notes (Signed)
MD at bedside. 

## 2015-10-14 NOTE — ED Notes (Signed)
Pt tolerated ambulation well.

## 2015-10-14 NOTE — ED Provider Notes (Signed)
CSN: [id number]     Arrival date & time 10/14/15  1150 History  By signing my name below, I, Richard Santiago, attest that this documentation has been prepared under the direction and in the presence of [name], MD. Electronically Signed: [name], Scribe. 10/14/2015. 12:37 PM.   Chief Complaint  Patient presents with  . Urinary Frequency   The history is provided by the patient and the spouse. No language interpreter was used.   HPI Comments: Level 5 Caveat due to dementia  Richard Santiago is a 82 y.o. male with a hx of trigeminal neuralgia, fibromyalgia, and glaucoma who presents to the Emergency Department complaining of sudden onset of a subjective fever and uncontrollable shaking yesterday. Pt's wife also notes associated emesis yesterday as well as loss of appetite, rhinorrhea, cough, foul smelling, dark urine, and frequent urination. Pt also experienced another episode of uncontrollable shaking this morning as well as onset of a fever. Pt denies SOB, HA, chest pain, abdominal pain, nausea, dysuria, and hematuria. He also denies hx of DM. Pt did not receive a flu shot this past year and has not currently taken any new medications. Pt's wife denies recent falls or injuries.   Past Medical History  Diagnosis Date  . Gastroenteritis   . OSA on CPAP   . Neuralgia   . Kidney stone   . Hyperlipemia   . Fibromyalgia   . Trigeminal neuralgia   . Glaucoma    Past Surgical History  Procedure Laterality Date  . Hand surgery Right   . Colonoscopy    . Gamma knife      Dr. Angelyn Punt x 2   Family History  Problem Relation Age of Onset  . Cancer Mother   . Heart attack Father    Social History  Substance Use Topics  . Smoking status: Never Smoker   . Smokeless tobacco: None  . Alcohol Use: No    Review of Systems A complete 10 system review of systems was obtained and all systems are negative except as noted in the HPI and PMH.   Allergies  Gabapentin;  Lyrica; Quinapril; Ciprofloxacin; Tegretol; and Trileptal  Home Medications   Prior to Admission medications   Medication Sig Start Date End Date Taking? Authorizing Provider  amLODipine (NORVASC) 10 MG tablet Take 10 mg by mouth daily. Take in addition to  tab - total  daily.   Yes Historical Provider, MD  amLODipine (NORVASC) 5 MG tablet Take 5 mg by mouth daily. Take in addition to  tablet - total  daily.   Yes Historical Provider, MD  gabapentin (NEURONTIN) 600 MG tablet Take 1 tablet (600 mg total) by mouth 5 (five) times daily. Take one tab three times daily and two tablets at bedtime. 07/30/15  Yes Levert Feinstein, MD  latanoprost (XALATAN) 0.005 % ophthalmic solution Place 1 drop into both eyes at bedtime.  03/13/15  Yes Historical Provider, MD  LORazepam (ATIVAN) 0.5 MG tablet Take 1 tablet by mouth 2 (two) times daily. 09/28/15  Yes Historical Provider, MD  Multiple Vitamin (MULTIVITAMIN) tablet Take 1 tablet by mouth daily.   Yes Historical Provider, MD  timolol (TIMOPTIC) 0.5 % ophthalmic solution Place 1 drop into both eyes daily.  03/13/15  Yes Historical Provider, MD  fentaNYL (DURAGESIC) 25 MCG/HR patch Place 1 patch (25 mcg total) onto the skin every 3 (three) days. Patient not taking: Reported on 10/14/2015 06/11/15   Levert Feinstein, MD   BP 137/88 mmHg  Pulse 93  Temp(Src) 100.4 F (38 C) (Core (Comment))  Resp 18  Ht 5\' 9"  (1.753 m)  Wt 154 lb (69.854 kg)  BMI 22.73 kg/m2  SpO2 97% Physical Exam  Constitutional: He appears well-developed and well-nourished. No distress.  Level 5 Caveat: Dementia. Oriented x2 Feels warm  HENT:  Head: Normocephalic and atraumatic.  Mouth/Throat: No oropharyngeal exudate.  Dry mucous membranes  Eyes: Conjunctivae and EOM are normal. Pupils are equal, round, and reactive to light.  Neck: Normal range of motion. Neck supple.  No meningismus.  Cardiovascular: Normal rate, regular rhythm, normal heart sounds and intact distal  pulses.   No murmur heard. Pulmonary/Chest: No respiratory distress.  Decreased breath sounds at the bases  Abdominal: Soft. There is no tenderness. There is no rebound and no guarding.  Musculoskeletal: Normal range of motion. He exhibits no edema or tenderness.  Neurological:   5/5 strength throughout. Moving all extremities. Unable to cooperate for finger to nose.  Skin: Skin is warm.  Psychiatric: He has a normal mood and affect. His behavior is normal.  Nursing note and vitals reviewed.   ED Course  Procedures  DIAGNOSTIC STUDIES:    Oxygen Saturation is 96% on RA, adequate by my interpretation.  COORDINATION OF CARE:  12:29 PM Will order x ray of chest and lab work.  Discussed treatment plan with pt at bedside and pt agreed to plan.   Labs Review Labs Reviewed  URINALYSIS, ROUTINE W REFLEX MICROSCOPIC (NOT AT [facility]) - Abnormal; Notable for the following:    APPearance HAZY (*)    Hgb urine dipstick MODERATE (*)    Protein, ur 100 (*)    Leukocytes, UA SMALL (*)    All other components within normal limits  CBC WITH DIFFERENTIAL/PLATELET - Abnormal; Notable for the following:    RBC 3.63 (*)    Hemoglobin 10.6 (*)    HCT 32.3 (*)    Lymphs Abs 0.5 (*)    All other components within normal limits  COMPREHENSIVE METABOLIC PANEL - Abnormal; Notable for the following:    Glucose, Bld 128 (*)    BUN 26 (*)    Creatinine, Ser 1.72 (*)    Calcium 8.5 (*)    Albumin 3.2 (*)    Total Bilirubin 1.5 (*)    GFR calc non Af Amer 36 (*)    GFR calc Af Amer 41 (*)    All other components within normal limits  URINE MICROSCOPIC-ADD ON - Abnormal; Notable for the following:    Bacteria, UA MANY (*)    All other components within normal limits  CULTURE, BLOOD (ROUTINE X 2)  CULTURE, BLOOD (ROUTINE X 2)  URINE CULTURE  RAPID STREP SCREEN (NOT AT [facility])  INFLUENZA PANEL BY PCR (TYPE A & B, H1N1)  POC OCCULT BLOOD, ED  I-STAT CG4 LACTIC ACID, ED  I-STAT CG4 LACTIC ACID, ED     Imaging Review Dg Chest 2 View  10/14/2015  CLINICAL DATA:  Fever, flu-like symptoms. EXAM: CHEST  2 VIEW COMPARISON:  06/07/2011 FINDINGS: Heart and mediastinal contours are within normal limits. No focal opacities or effusions. No acute bony abnormality. IMPRESSION: No active cardiopulmonary disease. Electronically Signed   By: Charlett Nose M.D.   On: 10/14/2015 13:49   Ct Renal Stone Study  10/14/2015  CLINICAL DATA:  Patient with hematuria. Fever and chills. History of renal stones. EXAM: CT ABDOMEN AND PELVIS WITHOUT CONTRAST TECHNIQUE: Multidetector CT imaging of the abdomen and pelvis was performed  following the standard protocol without IV contrast. COMPARISON:  None. FINDINGS: Lower chest: Normal heart size. There are consolidative opacities within the bilateral lower lobes. No pleural effusion. Hepatobiliary: Liver is normal in size and contour. Liver is diffusely low in attenuation. Gallbladder is unremarkable. Pancreas: Unremarkable Spleen: Unremarkable Adrenals/Urinary Tract: Mild bilateral adrenal thickening. The urinary bladder is markedly distended. There is mild bilateral hydroureteronephrosis. There is a 3.3 cm cyst off the inferior pole of the left kidney. There is a 1.7 cm cyst within the inferior pole of the right kidney. Extensive fat stranding about the right kidney and proximal right ureter. Mild stranding about the left kidney. Stomach/Bowel: No abnormal bowel wall thickening or evidence for bowel obstruction. No free intraperitoneal air. Small hiatal hernia. Vascular/Lymphatic: Normal caliber abdominal aorta. Peripheral calcified atherosclerotic plaque. No retroperitoneal lymphadenopathy. Other: Prostate enlarged. Musculoskeletal: Lumbar spine degenerative changes. No aggressive or acute appearing osseous lesions. IMPRESSION: There is mild bilateral hydronephrosis, left-greater-than-right. There is extensive fat stranding about the right kidney and proximal right ureter which is  nonspecific in etiology however may be secondary to pyelonephritis in the appropriate clinical setting. The urinary bladder is massively distended. Recommend decompression with Foley catheter. Electronically Signed   By: Annia Belt M.D.   On: 10/14/2015 17:13   I have personally reviewed and evaluated these images and lab results as part of my medical decision-making.  MDM   Final diagnoses:  Hematuria  Pyelonephritis   Patient from home with 2 day history of shaking chills and subjective fevers with foul-smelling urine. Confusion at baseline. Patient denies any pain.  Hemoglobin 10.8, no comparison.  Febrile to 103 on arrival. Cr 1.72.  IVF given. Flu swab negative.  lactate normal. No leukocytosis.   Patient at his mental baseline per family. He is tolerating by mouth and ambulatory. They would like him to go home if possible. Blood cultures and urine cultures pending. Suspect his fever and shaking chills are due to urinary tract infection.   CT obtained to evaluate for pyelonephritis. There is no kidney stone but there is extensive stranding of right kidney and proximal ureter with hydronephrosis. Also distended bladder. We'll place Foley catheter. Admission for UTI and pyelonephritis. D/w Dr. Conley Rolls   I personally performed the services described in this documentation, which was scribed in my presence. The recorded information has been reviewed and is accurate.   [name], MD 10/14/15 [phone]

## 2015-10-14 NOTE — ED Notes (Signed)
Foley temp reading 40.6, rectal temp checked 104.9. Dr. Nedra Hai informed. No orders at this time, states there should be parameters for when he arrives to floor

## 2015-10-14 NOTE — H&P (Signed)
[facility] History and Physical  Richard Santiago:[id number] DOB: 05-05-1933    PCP:   Ignatius Specking., MD   Chief Complaint: Chills.   HPI: Richard Santiago is an 82 y.o. male lives at home with his wife, hx of OSA on CPAP, nephrolithiasis, trigeminal neuralgia, presented to the ER with rigors and subjective fever, along with polyuria.  He denied sorethroat, stiff neck, coughs, abdominal pain.  Evaluation in the ER showed no leukocytosis, Cr of 1.7, Hb of 10.6 grams (not new-- Guiac negative), and UA showed 6-30 WBCs.  His abd/pelvic CT showed evidence of mild bilateral hydronephrosis, enlarged prostate, and no kidney stones.  BC and urine culture were obtained, and IV Rocephin was given.  He was not hypotensive and his lactic acid was normal.  Hospitalist was asked to admit him for pyelonephritis.    Rewiew of Systems:  Constitutional: Negative for malaise, fever and chills. No significant weight loss or weight gain Eyes: Negative for eye pain, redness and discharge, diplopia, visual changes, or flashes of light. ENMT: Negative for ear pain, hoarseness, nasal congestion, sinus pressure and sore throat. No headaches; tinnitus, drooling, or problem swallowing. Cardiovascular: Negative for chest pain, palpitations, diaphoresis, dyspnea and peripheral edema. ; No orthopnea, PND Respiratory: Negative for cough, hemoptysis, wheezing and stridor. No pleuritic chestpain. Gastrointestinal: Negative for nausea, vomiting, diarrhea, constipation, abdominal pain, melena, blood in stool, hematemesis, jaundice and rectal bleeding.    Genitourinary: Negative for frequency, dysuria, incontinence,flank pain and hematuria; Musculoskeletal: Negative for back pain and neck pain. Negative for swelling and trauma.;  Skin: . Negative for pruritus, rash, abrasions, bruising and skin lesion.; ulcerations Neuro: Negative for headache, lightheadedness and neck stiffness. Negative for weakness, altered level of  consciousness , altered mental status, extremity weakness, burning feet, involuntary movement, seizure and syncope.  Psych: negative for anxiety, depression, insomnia, tearfulness, panic attacks, hallucinations, paranoia, suicidal or homicidal ideation    Past Medical History  Diagnosis Date  . Gastroenteritis   . OSA on CPAP   . Neuralgia   . Kidney stone   . Hyperlipemia   . Fibromyalgia   . Trigeminal neuralgia   . Glaucoma     Past Surgical History  Procedure Laterality Date  . Hand surgery Right   . Colonoscopy    . Gamma knife      Dr. Angelyn Punt x 2    Medications:  HOME MEDS: Prior to Admission medications   Medication Sig Start Date End Date Taking? Authorizing Provider  amLODipine (NORVASC) 10 MG tablet Take 10 mg by mouth daily. Take in addition to  tab - total  daily.   Yes Historical Provider, MD  amLODipine (NORVASC) 5 MG tablet Take 5 mg by mouth daily. Take in addition to  tablet - total  daily.   Yes Historical Provider, MD  gabapentin (NEURONTIN) 600 MG tablet Take 1 tablet (600 mg total) by mouth 5 (five) times daily. Take one tab three times daily and two tablets at bedtime. 07/30/15  Yes Levert Feinstein, MD  latanoprost (XALATAN) 0.005 % ophthalmic solution Place 1 drop into both eyes at bedtime.  03/13/15  Yes Historical Provider, MD  LORazepam (ATIVAN) 0.5 MG tablet Take 1 tablet by mouth 2 (two) times daily. 09/28/15  Yes Historical Provider, MD  Multiple Vitamin (MULTIVITAMIN) tablet Take 1 tablet by mouth daily.   Yes Historical Provider, MD  timolol (TIMOPTIC) 0.5 % ophthalmic solution Place 1 drop into both eyes daily.  03/13/15  Yes Historical Provider, MD  fentaNYL (DURAGESIC) 25 MCG/HR patch Place 1 patch (25 mcg total) onto the skin every 3 (three) days. Patient not taking: Reported on 10/14/2015 06/11/15   Levert Feinstein, MD     Allergies:  Allergies  Allergen Reactions  . Gabapentin Other (See Comments)    Fatigue, confusion  . Lyrica  [Pregabalin] Other (See Comments)    Fatigue  . Quinapril Swelling    Fatigue  . Ciprofloxacin Rash  . Tegretol [Carbamazepine] Rash  . Trileptal [Oxcarbazepine] Rash    Social History:   reports that he has never smoked. He does not have any smokeless tobacco history on file. He reports that he does not drink alcohol or use illicit drugs.  Family History: Family History  Problem Relation Age of Onset  . Cancer Mother   . Heart attack Father      Physical Exam: Filed Vitals:   10/14/15 1530 10/14/15 1600 10/14/15 1745 10/14/15 1807  BP: 125/61 139/61 137/88   Pulse: 81 82 93   Temp:   99.1 F (37.3 C) 100.4 F (38 C)  TempSrc:   Core (Comment) Core (Comment)  Resp:   18   Height:      Weight:      SpO2: 98% 98% 97%    Blood pressure 137/88, pulse 93, temperature 100.4 F (38 C), temperature source Core (Comment), resp. rate 18, height 5\' 9"  (1.753 m), weight 69.854 kg (154 lb), SpO2 97 %.  GEN:  Pleasant patient lying in the stretcher in no acute distress; cooperative with exam. PSYCH:  alert and oriented x4; does not appear anxious or depressed; affect is appropriate. HEENT: Mucous membranes pink and anicteric; PERRLA; EOM intact; no cervical lymphadenopathy nor thyromegaly or carotid bruit; no JVD; There were no stridor. Neck is very supple. Breasts:: Not examined CHEST WALL: No tenderness CHEST: Normal respiration, clear to auscultation bilaterally.  HEART: Regular rate and rhythm.  There are no murmur, rub, or gallops.   BACK: No kyphosis or scoliosis; no CVA tenderness ABDOMEN: soft and non-tender; no masses, no organomegaly, normal abdominal bowel sounds; no pannus; no intertriginous candida. There is no rebound and no distention. Rectal Exam: Not done EXTREMITIES: No bone or joint deformity; age-appropriate arthropathy of the hands and knees; no edema; no ulcerations.  There is no calf tenderness. Genitalia: not examined PULSES: 2+ and symmetric SKIN: Normal  hydration no rash or ulceration CNS: Cranial nerves 2-12 grossly intact no focal lateralizing neurologic deficit.  Speech is fluent; uvula elevated with phonation, facial symmetry and tongue midline. DTR are normal bilaterally, cerebella exam is intact, barbinski is negative and strengths are equaled bilaterally.  No sensory loss.   Labs on Admission:  Basic Metabolic Panel:  Recent Labs Lab 10/14/15 1246  NA 139  K 3.8  CL 103  CO2 28  GLUCOSE 128*  BUN 26*  CREATININE 1.72*  CALCIUM 8.5*   Liver Function Tests:  Recent Labs Lab 10/14/15 1246  AST 37  ALT 36  ALKPHOS 91  BILITOT 1.5*  PROT 6.5  ALBUMIN 3.2*   CBC:  Recent Labs Lab 10/14/15 1246  WBC 7.7  NEUTROABS 6.2  HGB 10.6*  HCT 32.3*  MCV 89.0  PLT 160    Radiological Exams on Admission: Dg Chest 2 View  10/14/2015  CLINICAL DATA:  Fever, flu-like symptoms. EXAM: CHEST  2 VIEW COMPARISON:  06/07/2011 FINDINGS: Heart and mediastinal contours are within normal limits. No focal opacities or effusions. No acute bony abnormality. IMPRESSION: No active cardiopulmonary disease.  Electronically Signed   By: Charlett Nose M.D.   On: 10/14/2015 13:49   Ct Renal Stone Study  10/14/2015  CLINICAL DATA:  Patient with hematuria. Fever and chills. History of renal stones. EXAM: CT ABDOMEN AND PELVIS WITHOUT CONTRAST TECHNIQUE: Multidetector CT imaging of the abdomen and pelvis was performed following the standard protocol without IV contrast. COMPARISON:  None. FINDINGS: Lower chest: Normal heart size. There are consolidative opacities within the bilateral lower lobes. No pleural effusion. Hepatobiliary: Liver is normal in size and contour. Liver is diffusely low in attenuation. Gallbladder is unremarkable. Pancreas: Unremarkable Spleen: Unremarkable Adrenals/Urinary Tract: Mild bilateral adrenal thickening. The urinary bladder is markedly distended. There is mild bilateral hydroureteronephrosis. There is a 3.3 cm cyst off the  inferior pole of the left kidney. There is a 1.7 cm cyst within the inferior pole of the right kidney. Extensive fat stranding about the right kidney and proximal right ureter. Mild stranding about the left kidney. Stomach/Bowel: No abnormal bowel wall thickening or evidence for bowel obstruction. No free intraperitoneal air. Small hiatal hernia. Vascular/Lymphatic: Normal caliber abdominal aorta. Peripheral calcified atherosclerotic plaque. No retroperitoneal lymphadenopathy. Other: Prostate enlarged. Musculoskeletal: Lumbar spine degenerative changes. No aggressive or acute appearing osseous lesions. IMPRESSION: There is mild bilateral hydronephrosis, left-greater-than-right. There is extensive fat stranding about the right kidney and proximal right ureter which is nonspecific in etiology however may be secondary to pyelonephritis in the appropriate clinical setting. The urinary bladder is massively distended. Recommend decompression with Foley catheter. Electronically Signed   By: Annia Belt M.D.   On: 10/14/2015 17:13    Assessment/Plan Present on Admission:  . Pyelonephritis . Hyperlipidemia . Sepsis (HCC)   PLAN:  Pyelonephrtis:  Will continue with IV Recephin.  BC and urine culture were done.  His lactic acid was OK at this time.  Hemodynamically OK, but will hold Norvasc.  Hydronephrosis and AKI likely from BPH.   HLD: Will continue with his statin.  AKI:  Continue with IVF. Follow Cr carefully.   Other plans as per orders. Code Status: FULL Unk Lightning, MD. FACP [facility] Pager [phone] 7pm to 7am.  10/14/2015, 6:42 PM

## 2015-10-15 ENCOUNTER — Inpatient Hospital Stay (HOSPITAL_COMMUNITY): Payer: Medicare PPO

## 2015-10-15 ENCOUNTER — Encounter (HOSPITAL_COMMUNITY): Payer: Self-pay

## 2015-10-15 DIAGNOSIS — R319 Hematuria, unspecified: Secondary | ICD-10-CM | POA: Insufficient documentation

## 2015-10-15 LAB — COMPREHENSIVE METABOLIC PANEL
ALBUMIN: 3.1 g/dL — AB (ref 3.5–5.0)
ALK PHOS: 95 U/L (ref 38–126)
ALT: 33 U/L (ref 17–63)
ANION GAP: 8 (ref 5–15)
AST: 48 U/L — ABNORMAL HIGH (ref 15–41)
BILIRUBIN TOTAL: 1.9 mg/dL — AB (ref 0.3–1.2)
BUN: 19 mg/dL (ref 6–20)
CALCIUM: 8.4 mg/dL — AB (ref 8.9–10.3)
CO2: 28 mmol/L (ref 22–32)
Chloride: 105 mmol/L (ref 101–111)
Creatinine, Ser: 1.44 mg/dL — ABNORMAL HIGH (ref 0.61–1.24)
GFR calc Af Amer: 51 mL/min — ABNORMAL LOW (ref >60)
GFR, EST NON AFRICAN AMERICAN: 44 mL/min — AB (ref >60)
GLUCOSE: 125 mg/dL — AB (ref 65–99)
POTASSIUM: 4 mmol/L (ref 3.5–5.1)
Sodium: 141 mmol/L (ref 135–145)
TOTAL PROTEIN: 6.7 g/dL (ref 6.5–8.1)

## 2015-10-15 LAB — CBC
HEMATOCRIT: 34 % — AB (ref 39.0–52.0)
HEMOGLOBIN: 11.1 g/dL — AB (ref 13.0–17.0)
MCH: 29.1 pg (ref 26.0–34.0)
MCHC: 32.6 g/dL (ref 30.0–36.0)
MCV: 89.2 fL (ref 78.0–100.0)
Platelets: 164 10*3/uL (ref 150–400)
RBC: 3.81 MIL/uL — ABNORMAL LOW (ref 4.22–5.81)
RDW: 12.3 % (ref 11.5–15.5)
WBC: 7.1 10*3/uL (ref 4.0–10.5)

## 2015-10-15 LAB — RAPID STREP SCREEN (MED CTR MEBANE ONLY): STREPTOCOCCUS, GROUP A SCREEN (DIRECT): NEGATIVE

## 2015-10-15 MED ORDER — PIPERACILLIN-TAZOBACTAM 3.375 G IVPB
3.3750 g | Freq: Three times a day (TID) | INTRAVENOUS | Status: DC
  Administered 2015-10-15 – 2015-10-16 (×4): 3.375 g via INTRAVENOUS
  Filled 2015-10-15 (×4): qty 50

## 2015-10-15 MED ORDER — SODIUM CHLORIDE 0.9 % IV BOLUS (SEPSIS)
250.0000 mL | Freq: Once | INTRAVENOUS | Status: AC
Start: 2015-10-15 — End: 2015-10-15
  Administered 2015-10-15: 250 mL via INTRAVENOUS

## 2015-10-15 MED ORDER — LATANOPROST 0.005 % OP SOLN
OPHTHALMIC | Status: AC
  Filled 2015-10-15: qty 2.5

## 2015-10-15 MED ORDER — SODIUM CHLORIDE 0.9 % IV SOLN
INTRAVENOUS | Status: DC
  Administered 2015-10-15 – 2015-10-18 (×6): via INTRAVENOUS

## 2015-10-15 NOTE — Progress Notes (Signed)
Pharmacy Antibiotic Note  Richard Santiago is a 82 y.o. male admitted on 10/14/2015 with intra-abdominal infection.  Pharmacy has been consulted for ZOSYN dosing.  Plan: Zosyn 3.375g IV q8h (4 hour infusion).  Monitor labs, renal fxn, progress and c/s  Height:  (175.3 cm) Weight: 149 lb 3.2 oz (67.677 kg) IBW/kg (Calculated) : 70.7  Temp (24hrs), Avg:101.7 F (38.7 C), Min:98 F (36.7 C), Max:105.1 F (40.6 C)   Recent Labs Lab 10/14/15 1246 10/14/15 1609 10/15/15 0531  WBC 7.7  --  7.1  CREATININE 1.72*  --  1.44*  LATICACIDVEN  --  0.80  --     Estimated Creatinine Clearance: 39.2 mL/min (by C-G formula based on Cr of 1.44).    Allergies  Allergen Reactions  . Gabapentin Other (See Comments)    Fatigue, confusion  . Lyrica [Pregabalin] Other (See Comments)    Fatigue  . Quinapril Swelling    Fatigue  . Ciprofloxacin Rash  . Tegretol [Carbamazepine] Rash  . Trileptal [Oxcarbazepine] Rash   Results for orders placed or performed during the hospital encounter of 10/14/15  Blood culture (routine x 2)     Status: None (Preliminary result)   Collection Time: 10/14/15  3:37 PM  Result Value Ref Range Status   Specimen Description BLOOD LEFT WRIST  Final   Special Requests BOTTLES DRAWN AEROBIC AND ANAEROBIC 4CC EACH  Final   Culture PENDING  Incomplete   Report Status PENDING  Incomplete  Blood culture (routine x 2)     Status: None (Preliminary result)   Collection Time: 10/14/15  3:49 PM  Result Value Ref Range Status   Specimen Description BLOOD LEFT HAND  Final   Special Requests BOTTLES DRAWN AEROBIC AND ANAEROBIC 4CC EACH  Final   Culture PENDING  Incomplete   Report Status PENDING  Incomplete    Anti-infectives    Start     Dose/Rate Route Frequency Ordered Stop   10/15/15 1100  piperacillin-tazobactam (ZOSYN) IVPB 3.375 g     3.375 g 12.5 mL/hr over 240 Minutes Intravenous Every 8 hours 10/15/15 1010     10/14/15 2100  cefTRIAXone (ROCEPHIN) 1 g in  dextrose 5 % 50 mL IVPB  Status:  Discontinued     1 g 100 mL/hr over 30 Minutes Intravenous Every 24 hours 10/14/15 2015 10/15/15 0943   10/14/15 1530  cefTRIAXone (ROCEPHIN) 1 g in dextrose 5 % 50 mL IVPB     1 g 100 mL/hr over 30 Minutes Intravenous  Once 10/14/15 1519 10/14/15 1800   10/14/15 1515  cefTRIAXone (ROCEPHIN) 1 g in dextrose 5 % 50 mL IVPB  Status:  Discontinued     1 g 100 mL/hr over 30 Minutes Intravenous  Once 10/14/15 1511 10/14/15 1518    Thank you for allowing pharmacy to be a part of this patient's care.  Valrie Hart A 10/15/2015 10:12 AM

## 2015-10-15 NOTE — Progress Notes (Signed)
[facility] PROGRESS NOTE  CHANC Richard Santiago:[id number] DOB: 1932-09-25 DOA: 10/14/2015 PCP: Ignatius Specking., MD  Length of stay: 1   Assessment/Plan: Active Problems:   Pyelonephritis   Hyperlipidemia   Sepsis (HCC)   Hematuria  Brief summary  82 y.o. male lives at home with his wife, hx of OSA on CPAP, nephrolithiasis, trigeminal neuralgia, presented to the ER with rigors and subjective fever, along with polyuria. He denied sorethroat, stiff neck, coughs, abdominal pain. Evaluation in the ER showed no leukocytosis, Cr of 1.7, Hb of 10.6 grams (not new-- Guiac negative), and UA showed 6-30 WBCs. His abd/pelvic CT showed evidence of mild bilateral hydronephrosis, enlarged prostate, and no kidney stones. BC and urine culture were obtained, and IV Rocephin was given. He was not hypotensive and his lactic acid was normal. Hospitalist was asked to admit him for pyelonephritis.   Assessment and plan Right pyelonephritis Bilateral hydronephrosis on CT left greater than right, likely Secondary to bladder outlet obstruction, discussed with Dr. Sherryl Barters, continue Foley to decompress the bladder, strict I and O's Repeat renal ultrasound in a couple of days to rule out hydronephrosis of the fever persists Patient started on Rocephin, will switch to Zosyn for broader coverage as the patient is still spiking a fever Flu PCR negative Closely follow blood culture, urine culture results Patient has a prior history of nephrolithiasis  Acute kidney injury Baseline creatinine unknown, creatinine 1.72 on admission, now 1.44 Telephone Urology consultation for bilateral hydronephrosis, repeat renal ultrasound in 1-2 days to ensure resolution of hydronephrosis  Hypertension-hold Norvasc for now, hydrate with IV fluids  Fibromyalgia-continue gabapentin, fentanyl patch  sleep apnea on CPAP machine since 2004  DVT prophylaxsis heparin  Code Status:      Code Status Orders         Start     Ordered   10/14/15 2016  Full code   Continuous     10/14/15 2015    Code Status History    Date Active Date Inactive Code Status Order ID Comments User Context   This patient has a current code status but no historical code status.      Family Communication: Discussed in detail with the patient, all imaging results, lab results explained to the patient   Disposition Plan:  Anticipate discharge in 3-4 days    Consultants:  None    Procedures:  None  Antibiotics: Anti-infectives    Start     Dose/Rate Route Frequency Ordered Stop   10/14/15 2100  cefTRIAXone (ROCEPHIN) 1 g in dextrose 5 % 50 mL IVPB  Status:  Discontinued     1 g 100 mL/hr over 30 Minutes Intravenous Every 24 hours 10/14/15 2015 10/15/15 0943   10/14/15 1530  cefTRIAXone (ROCEPHIN) 1 g in dextrose 5 % 50 mL IVPB     1 g 100 mL/hr over 30 Minutes Intravenous  Once 10/14/15 1519 10/14/15 1800   10/14/15 1515  cefTRIAXone (ROCEPHIN) 1 g in dextrose 5 % 50 mL IVPB  Status:  Discontinued     1 g 100 mL/hr over 30 Minutes Intravenous  Once 10/14/15 1511 10/14/15 1518         HPI/Subjective: Patient resting comfortably in bed, wife is by the bedside, patient feels a lot better than yesterday however still continues to have intermittent high-grade fevers, now has a cooling blanket  Objective: Filed Vitals:   10/14/15 2008 10/14/15 2234 10/15/15 0500 10/15/15 0934  BP: 136/56  143/65  Pulse: 95 98 80   Temp: 103.1 F (39.5 C)  101 F (38.3 C)   TempSrc: Oral  Rectal   Resp: Height:  (1.753 m)     Weight: 67.677 kg (149 lb 3.2 oz)     SpO2: 94% 93% 98% 96%    Intake/Output Summary (Last 24 hours) at 10/15/15 0949 Last data filed at 10/15/15 0944  Gross per 24 hour  Intake    240 ml  Output      1 ml  Net    239 ml    Exam:  General: No acute respiratory distress Lungs: Clear to auscultation bilaterally without wheezes or crackles Cardiovascular: Regular rate and  rhythm without murmur gallop or rub normal S1 and S2 Abdomen: Nontender, nondistended, soft, bowel sounds positive, no rebound, no ascites, no appreciable mass Extremities: No significant cyanosis, clubbing, or edema bilateral lower extremities     Data Review   Micro Results Recent Results (from the past 240 hour(s))  Blood culture (routine x 2)     Status: None (Preliminary result)   Collection Time: 10/14/15  3:37 PM  Result Value Ref Range Status   Specimen Description BLOOD LEFT WRIST  Final   Special Requests BOTTLES DRAWN AEROBIC AND ANAEROBIC 4CC EACH  Final   Culture PENDING  Incomplete   Report Status PENDING  Incomplete  Blood culture (routine x 2)     Status: None (Preliminary result)   Collection Time: 10/14/15  3:49 PM  Result Value Ref Range Status   Specimen Description BLOOD LEFT HAND  Final   Special Requests BOTTLES DRAWN AEROBIC AND ANAEROBIC 4CC EACH  Final   Culture PENDING  Incomplete   Report Status PENDING  Incomplete    Radiology Reports Dg Chest 2 View  10/14/2015  CLINICAL DATA:  Fever, flu-like symptoms. EXAM: CHEST  2 VIEW COMPARISON:  06/07/2011 FINDINGS: Heart and mediastinal contours are within normal limits. No focal opacities or effusions. No acute bony abnormality. IMPRESSION: No active cardiopulmonary disease. Electronically Signed   By: Charlett Nose M.D.   On: 10/14/2015 13:49   Ct Renal Stone Study  10/14/2015  CLINICAL DATA:  Patient with hematuria. Fever and chills. History of renal stones. EXAM: CT ABDOMEN AND PELVIS WITHOUT CONTRAST TECHNIQUE: Multidetector CT imaging of the abdomen and pelvis was performed following the standard protocol without IV contrast. COMPARISON:  None. FINDINGS: Lower chest: Normal heart size. There are consolidative opacities within the bilateral lower lobes. No pleural effusion. Hepatobiliary: Liver is normal in size and contour. Liver is diffusely low in attenuation. Gallbladder is unremarkable. Pancreas:  Unremarkable Spleen: Unremarkable Adrenals/Urinary Tract: Mild bilateral adrenal thickening. The urinary bladder is markedly distended. There is mild bilateral hydroureteronephrosis. There is a 3.3 cm cyst off the inferior pole of the left kidney. There is a 1.7 cm cyst within the inferior pole of the right kidney. Extensive fat stranding about the right kidney and proximal right ureter. Mild stranding about the left kidney. Stomach/Bowel: No abnormal bowel wall thickening or evidence for bowel obstruction. No free intraperitoneal air. Small hiatal hernia. Vascular/Lymphatic: Normal caliber abdominal aorta. Peripheral calcified atherosclerotic plaque. No retroperitoneal lymphadenopathy. Other: Prostate enlarged. Musculoskeletal: Lumbar spine degenerative changes. No aggressive or acute appearing osseous lesions. IMPRESSION: There is mild bilateral hydronephrosis, left-greater-than-right. There is extensive fat stranding about the right kidney and proximal right ureter which is nonspecific in etiology however may be secondary to pyelonephritis in the appropriate clinical setting. The urinary bladder  is massively distended. Recommend decompression with Foley catheter. Electronically Signed   By: Annia Belt M.D.   On: 10/14/2015 17:13     CBC  Recent Labs Lab 10/14/15 1246 10/15/15 0531  WBC 7.7 7.1  HGB 10.6* 11.1*  HCT 32.3* 34.0*  PLT 160 164  MCV 89.0 89.2  MCH 29.2 29.1  MCHC 32.8 32.6  RDW 12.1 12.3  LYMPHSABS 0.5*  --   MONOABS 1.0  --   EOSABS 0.0  --   BASOSABS 0.0  --     Chemistries   Recent Labs Lab 10/14/15 1246 10/15/15 0531  NA 139 141  K 3.8 4.0  CL 103 105  CO2 28 28  GLUCOSE 128* 125*  BUN 26* 19  CREATININE 1.72* 1.44*  CALCIUM 8.5* 8.4*  AST 37 48*  ALT 36 33  ALKPHOS 91 95  BILITOT 1.5* 1.9*   ------------------------------------------------------------------------------------------------------------------ estimated creatinine clearance is 39.2 mL/min (by  C-G formula based on Cr of 1.44). ------------------------------------------------------------------------------------------------------------------ No results for input(s): HGBA1C in the last 72 hours. ------------------------------------------------------------------------------------------------------------------ No results for input(s): CHOL, HDL, LDLCALC, TRIG, CHOLHDL, LDLDIRECT in the last 72 hours. ------------------------------------------------------------------------------------------------------------------  Recent Labs  10/14/15 1249  TSH 2.336   ------------------------------------------------------------------------------------------------------------------ No results for input(s): VITAMINB12, FOLATE, FERRITIN, TIBC, IRON, RETICCTPCT in the last 72 hours.  Coagulation profile No results for input(s): INR, PROTIME in the last 168 hours.  No results for input(s): DDIMER in the last 72 hours.  Cardiac Enzymes No results for input(s): CKMB, TROPONINI, MYOGLOBIN in the last 168 hours.  Invalid input(s): CK ------------------------------------------------------------------------------------------------------------------ Invalid input(s): POCBNP   CBG: No results for input(s): GLUCAP in the last 168 hours.     Studies: Dg Chest 2 View  10/14/2015  CLINICAL DATA:  Fever, flu-like symptoms. EXAM: CHEST  2 VIEW COMPARISON:  06/07/2011 FINDINGS: Heart and mediastinal contours are within normal limits. No focal opacities or effusions. No acute bony abnormality. IMPRESSION: No active cardiopulmonary disease. Electronically Signed   By: Charlett Nose M.D.   On: 10/14/2015 13:49   Ct Renal Stone Study  10/14/2015  CLINICAL DATA:  Patient with hematuria. Fever and chills. History of renal stones. EXAM: CT ABDOMEN AND PELVIS WITHOUT CONTRAST TECHNIQUE: Multidetector CT imaging of the abdomen and pelvis was performed following the standard protocol without IV contrast. COMPARISON:   None. FINDINGS: Lower chest: Normal heart size. There are consolidative opacities within the bilateral lower lobes. No pleural effusion. Hepatobiliary: Liver is normal in size and contour. Liver is diffusely low in attenuation. Gallbladder is unremarkable. Pancreas: Unremarkable Spleen: Unremarkable Adrenals/Urinary Tract: Mild bilateral adrenal thickening. The urinary bladder is markedly distended. There is mild bilateral hydroureteronephrosis. There is a 3.3 cm cyst off the inferior pole of the left kidney. There is a 1.7 cm cyst within the inferior pole of the right kidney. Extensive fat stranding about the right kidney and proximal right ureter. Mild stranding about the left kidney. Stomach/Bowel: No abnormal bowel wall thickening or evidence for bowel obstruction. No free intraperitoneal air. Small hiatal hernia. Vascular/Lymphatic: Normal caliber abdominal aorta. Peripheral calcified atherosclerotic plaque. No retroperitoneal lymphadenopathy. Other: Prostate enlarged. Musculoskeletal: Lumbar spine degenerative changes. No aggressive or acute appearing osseous lesions. IMPRESSION: There is mild bilateral hydronephrosis, left-greater-than-right. There is extensive fat stranding about the right kidney and proximal right ureter which is nonspecific in etiology however may be secondary to pyelonephritis in the appropriate clinical setting. The urinary bladder is massively distended. Recommend decompression with Foley catheter. Electronically Signed   By: Kenard Gower  Earlene Plater M.D.   On: 10/14/2015 17:13      No results found for: HGBA1C Lab Results  Component Value Date   CREATININE 1.44* 10/15/2015       Scheduled Meds: . heparin  5,000 Units Subcutaneous 3 times per day  . latanoprost  1 drop Both Eyes QHS  . LORazepam  0.5 mg Oral BID  . sodium chloride flush  3 mL Intravenous Q12H  . timolol  1 drop Both Eyes Daily   Continuous Infusions: . dextrose 5 % and 0.9% NaCl 125 mL/hr at 10/15/15 1324     Active Problems:   Pyelonephritis   Hyperlipidemia   Sepsis (HCC)   Hematuria    Time spent: 45 minutes   [facility]  [facility] Pager [phone]. If 7PM-7AM, please contact night-coverage at [email or web address], password [facility] 10/15/2015, 9:49 AM  LOS: 1 day

## 2015-10-16 DIAGNOSIS — N179 Acute kidney failure, unspecified: Secondary | ICD-10-CM

## 2015-10-16 DIAGNOSIS — A4151 Sepsis due to Escherichia coli [E. coli]: Secondary | ICD-10-CM

## 2015-10-16 DIAGNOSIS — N1330 Unspecified hydronephrosis: Secondary | ICD-10-CM

## 2015-10-16 LAB — COMPREHENSIVE METABOLIC PANEL
ALK PHOS: 85 U/L (ref 38–126)
ALT: 28 U/L (ref 17–63)
ANION GAP: 8 (ref 5–15)
AST: 40 U/L (ref 15–41)
Albumin: 2.8 g/dL — ABNORMAL LOW (ref 3.5–5.0)
BILIRUBIN TOTAL: 1.1 mg/dL (ref 0.3–1.2)
BUN: 14 mg/dL (ref 6–20)
CALCIUM: 8.5 mg/dL — AB (ref 8.9–10.3)
CHLORIDE: 105 mmol/L (ref 101–111)
CO2: 27 mmol/L (ref 22–32)
CREATININE: 1.43 mg/dL — AB (ref 0.61–1.24)
GFR calc non Af Amer: 45 mL/min — ABNORMAL LOW (ref >60)
GFR, EST AFRICAN AMERICAN: 52 mL/min — AB (ref >60)
Glucose, Bld: 93 mg/dL (ref 65–99)
POTASSIUM: 3.7 mmol/L (ref 3.5–5.1)
SODIUM: 140 mmol/L (ref 135–145)
TOTAL PROTEIN: 6.2 g/dL — AB (ref 6.5–8.1)

## 2015-10-16 LAB — CBC
HCT: 30.6 % — ABNORMAL LOW (ref 39.0–52.0)
HEMOGLOBIN: 10.3 g/dL — AB (ref 13.0–17.0)
MCH: 29.6 pg (ref 26.0–34.0)
MCHC: 33.7 g/dL (ref 30.0–36.0)
MCV: 87.9 fL (ref 78.0–100.0)
Platelets: 152 10*3/uL (ref 150–400)
RBC: 3.48 MIL/uL — ABNORMAL LOW (ref 4.22–5.81)
RDW: 12.4 % (ref 11.5–15.5)
WBC: 4.1 10*3/uL (ref 4.0–10.5)

## 2015-10-16 MED ORDER — DEXTROSE 5 % IV SOLN
1.0000 g | INTRAVENOUS | Status: DC
  Administered 2015-10-16 – 2015-10-17 (×3): 1 g via INTRAVENOUS
  Filled 2015-10-16 (×3): qty 10

## 2015-10-16 MED ORDER — GABAPENTIN 600 MG PO TABS: 600.0000 mg | ORAL_TABLET | Freq: Every day | ORAL | Status: DC

## 2015-10-16 MED ORDER — TRAMADOL HCL 50 MG PO TABS
50.0000 mg | ORAL_TABLET | Freq: Three times a day (TID) | ORAL | Status: DC | PRN
  Administered 2015-10-16: 50 mg via ORAL
  Filled 2015-10-16: qty 1

## 2015-10-16 MED ORDER — GABAPENTIN 300 MG PO CAPS
600.0000 mg | ORAL_CAPSULE | Freq: Every day | ORAL | Status: DC
  Administered 2015-10-16 – 2015-10-19 (×15): 600 mg via ORAL
  Filled 2015-10-16 (×3): qty 2
  Filled 2015-10-16: qty 6
  Filled 2015-10-16 (×12): qty 2

## 2015-10-16 NOTE — Progress Notes (Signed)
[facility] PROGRESS NOTE  BASTION BOLGER JXB:[id number] DOB: May 18, 1933 DOA: 10/14/2015 PCP: Ignatius Specking., MD  Assessment/Plan: UTI -Cx with >100K E Coli. -DC Zosyn and start rocephin pending cx data.  Bilateral Hydronephrosis -Seen on CT scan with dilated bladder. -My partner, Dr. Susie Cassette, discussed case with GU, Dr. Sherryl Barters, who recommended placement of foley catheter to decompress the bladder and consideration to repeat renal US if fever persists.  ARF -Postobstructive. -Improving. -DC with foley and GU followup.  Right Trigeminal Neuralgia -Patient with significant pain today. -Will restart his neurontin.  Code Status: Full Code Family Communication: Discussed with son at bedside and all questions answered.  Disposition Plan: To be determined.   Consultants:  Telephone GU.   Antibiotics:  Rocephin   Subjective: C/o significant right jaw pain, spasms, that he associates as his trigeminal neuralgia.  Objective: Filed Vitals:   10/15/15 2146 10/16/15 0010 10/16/15 0533 10/16/15 1315  BP:  128/66 167/76 177/90  Pulse:  84 80 72  Temp:  100.1 F (37.8 C) 99.3 F (37.4 C) 98.5 F (36.9 C)  TempSrc:  Oral Axillary Axillary  Resp:  Height:      Weight:      SpO2: 98% 99% 99% 99%    Intake/Output Summary (Last 24 hours) at 10/16/15 1504 Last data filed at 10/16/15 1324  Gross per 24 hour  Intake    240 ml  Output   2301 ml  Net  -2061 ml   Filed Weights   10/14/15 1156 10/14/15 2008  Weight: 69.854 kg (154 lb) 67.677 kg (149 lb 3.2 oz)    Exam:   General:  AA  Cardiovascular: RRR  Respiratory: CTA B  Abdomen: S/NT/ND/+BS  Extremities: no C/C/E   Neurologic:  Grossly intact and non-focal  Data Reviewed: Basic Metabolic Panel:  Recent Labs Lab 10/14/15 1246 10/15/15 0531 10/16/15 0551  NA 139 141 140  K 3.8 4.0 3.7  CL 103 105 105  CO2 GLUCOSE 128* 125* 93  BUN 26* 19 14  CREATININE 1.72* 1.44*  1.43*  CALCIUM 8.5* 8.4* 8.5*   Liver Function Tests:  Recent Labs Lab 10/14/15 1246 10/15/15 0531 10/16/15 0551  AST 37 48* 40  ALT 36 33 28  ALKPHOS 91 95 85  BILITOT 1.5* 1.9* 1.1  PROT 6.5 6.7 6.2*  ALBUMIN 3.2* 3.1* 2.8*   No results for input(s): LIPASE, AMYLASE in the last 168 hours. No results for input(s): AMMONIA in the last 168 hours. CBC:  Recent Labs Lab 10/14/15 1246 10/15/15 0531 10/16/15 0551  WBC 7.7 7.1 4.1  NEUTROABS 6.2  --   --   HGB 10.6* 11.1* 10.3*  HCT 32.3* 34.0* 30.6*  MCV 89.0 89.2 87.9  PLT 160 164 152   Cardiac Enzymes: No results for input(s): CKTOTAL, CKMB, CKMBINDEX, TROPONINI in the last 168 hours. BNP (last 3 results) No results for input(s): BNP in the last 8760 hours.  ProBNP (last 3 results) No results for input(s): PROBNP in the last 8760 hours.  CBG: No results for input(s): GLUCAP in the last 168 hours.  Recent Results (from the past 240 hour(s))  Urine culture     Status: None (Preliminary result)   Collection Time: 10/14/15  1:43 PM  Result Value Ref Range Status   Specimen Description URINE, CLEAN CATCH  Final   Special Requests NONE  Final   Culture   Final    >=100,000 COLONIES/mL ESCHERICHIA COLI  Performed at [facility]    Report Status PENDING  Incomplete  Rapid strep screen (not at [facility])     Status: None   Collection Time: 10/14/15  3:33 PM  Result Value Ref Range Status   Streptococcus, Group A Screen (Direct) NEGATIVE NEGATIVE Final    Comment: (NOTE) A Rapid Antigen test may result negative if the antigen level in the sample is below the detection level of this test. The FDA has not cleared this test as a stand-alone test therefore the rapid antigen negative result has reflexed to a Group A Strep culture.   Culture, group A strep     Status: None (Preliminary result)   Collection Time: 10/14/15  3:33 PM  Result Value Ref Range Status   Specimen Description THROAT  Final   Special Requests  NONE Reflexed from [id number]  Final   Culture   Final    CULTURE REINCUBATED FOR BETTER GROWTH Performed at [facility]    Report Status PENDING  Incomplete  Blood culture (routine x 2)     Status: None (Preliminary result)   Collection Time: 10/14/15  3:37 PM  Result Value Ref Range Status   Specimen Description BLOOD LEFT WRIST  Final   Special Requests BOTTLES DRAWN AEROBIC AND ANAEROBIC 4CC EACH  Final   Culture NO GROWTH 2 DAYS  Final   Report Status PENDING  Incomplete  Blood culture (routine x 2)     Status: None (Preliminary result)   Collection Time: 10/14/15  3:49 PM  Result Value Ref Range Status   Specimen Description BLOOD LEFT HAND  Final   Special Requests BOTTLES DRAWN AEROBIC AND ANAEROBIC 4CC EACH  Final   Culture NO GROWTH 2 DAYS  Final   Report Status PENDING  Incomplete     Studies: US Renal  10/15/2015  CLINICAL DATA:  Acute pyelonephritis EXAM: RENAL / URINARY TRACT ULTRASOUND COMPLETE COMPARISON:  10/14/2015 FINDINGS: Right Kidney: Length: 10.4 cm. Echogenic parenchyma compared to adjacent liver. No hydronephrosis. Left Kidney: Length: 10.6 cm. No hydronephrosis. Partially exophytic 3.4 x 3 x 3.6 cm lower pole cyst. Bladder: Partially decompressed by Foley catheter. Low level echoes in the lumen. IMPRESSION: 1. Resolution of  hydronephrosis since previous CT . 2. Echogenic renal parenchyma, a nonspecific indicator of medical renal disease. 3. Benign-appearing left renal cyst, seen previously. 4. Low level echoes in the lumen of the incompletely distended urinary bladder, with Foley catheter. Electronically Signed   By: Corlis Leak M.D.   On: 10/15/2015 12:05   Ct Renal Stone Study  10/14/2015  CLINICAL DATA:  Patient with hematuria. Fever and chills. History of renal stones. EXAM: CT ABDOMEN AND PELVIS WITHOUT CONTRAST TECHNIQUE: Multidetector CT imaging of the abdomen and pelvis was performed following the standard protocol without IV contrast. COMPARISON:   None. FINDINGS: Lower chest: Normal heart size. There are consolidative opacities within the bilateral lower lobes. No pleural effusion. Hepatobiliary: Liver is normal in size and contour. Liver is diffusely low in attenuation. Gallbladder is unremarkable. Pancreas: Unremarkable Spleen: Unremarkable Adrenals/Urinary Tract: Mild bilateral adrenal thickening. The urinary bladder is markedly distended. There is mild bilateral hydroureteronephrosis. There is a 3.3 cm cyst off the inferior pole of the left kidney. There is a 1.7 cm cyst within the inferior pole of the right kidney. Extensive fat stranding about the right kidney and proximal right ureter. Mild stranding about the left kidney. Stomach/Bowel: No abnormal bowel wall thickening or evidence for bowel obstruction. No free intraperitoneal  air. Small hiatal hernia. Vascular/Lymphatic: Normal caliber abdominal aorta. Peripheral calcified atherosclerotic plaque. No retroperitoneal lymphadenopathy. Other: Prostate enlarged. Musculoskeletal: Lumbar spine degenerative changes. No aggressive or acute appearing osseous lesions. IMPRESSION: There is mild bilateral hydronephrosis, left-greater-than-right. There is extensive fat stranding about the right kidney and proximal right ureter which is nonspecific in etiology however may be secondary to pyelonephritis in the appropriate clinical setting. The urinary bladder is massively distended. Recommend decompression with Foley catheter. Electronically Signed   By: Annia Belt M.D.   On: 10/14/2015 17:13    Scheduled Meds: . cefTRIAXone (ROCEPHIN)  IV  1 g Intravenous Q24H  . gabapentin  600 mg Oral 5 X Daily  . heparin  5,000 Units Subcutaneous 3 times per day  . latanoprost  1 drop Both Eyes QHS  . LORazepam  0.5 mg Oral BID  . sodium chloride flush  3 mL Intravenous Q12H  . timolol  1 drop Both Eyes Daily   Continuous Infusions: . sodium chloride 100 mL/hr at 10/16/15 1351    Active Problems:    Pyelonephritis   Hyperlipidemia   Sepsis (HCC)   Hematuria   Hydronephrosis   ARF (acute renal failure) (HCC)    Time spent: 25 minutes. Greater than 50% of this time was spent in direct contact with the patient coordinating care.    Chaya Jan  [facility] Pager [phone]  If 7PM-7AM, please contact night-coverage at [email or web address], password [facility] 10/16/2015, 3:04 PM  LOS: 2 days

## 2015-10-16 NOTE — Care Management Note (Signed)
Case Management Note  Patient Details  Name: Richard Santiago MRN: [id number] Date of Birth: April 11, 1933  Subjective/Objective:                  Pt admitted with pyelonephritis. Pt is from home, lives with wife and has dementia. Pt ambulates independently without use of cane or walker. Pt has cane and walker to use if needed. Pt has dementia and is primarily cared for by his wife. Pt is able to feed himself and receives assistance as needed with bathing and dressing. Per son who is at bedside pt is supposed to use CPAP at home, but does not use regularly due to pain from trigeminal neuralgia. Per son plan is for pt to return home with wife. Pt is not communicative today due to pain from neuraliga but is alert.   Action/Plan: No CM needs anticipated but CM will need to further discuss DC plan with pt's wife prior to DC. Will cont to follow.    Expected Discharge Date:     10/18/2015             Expected Discharge Plan:  Home/Self Care  In-House Referral:  NA  Discharge planning Services  CM Consult  Post Acute Care Choice:  NA Choice offered to:  NA  DME Arranged:    DME Agency:     HH Arranged:    HH Agency:     Status of Service:  In process, will continue to follow  Medicare Important Message Given:    Date Medicare IM Given:    Medicare IM give by:    Date Additional Medicare IM Given:    Additional Medicare Important Message give by:     If discussed at Long Length of Stay Meetings, dates discussed:    Additional Comments:  Malcolm Metro, RN 10/16/2015, 2:02 PM

## 2015-10-17 ENCOUNTER — Inpatient Hospital Stay (HOSPITAL_COMMUNITY): Payer: Medicare PPO

## 2015-10-17 DIAGNOSIS — N12 Tubulo-interstitial nephritis, not specified as acute or chronic: Secondary | ICD-10-CM

## 2015-10-17 LAB — CBC
HCT: 35.1 % — ABNORMAL LOW (ref 39.0–52.0)
HEMOGLOBIN: 11.5 g/dL — AB (ref 13.0–17.0)
MCH: 28.5 pg (ref 26.0–34.0)
MCHC: 32.8 g/dL (ref 30.0–36.0)
MCV: 86.9 fL (ref 78.0–100.0)
Platelets: 181 10*3/uL (ref 150–400)
RBC: 4.04 MIL/uL — AB (ref 4.22–5.81)
RDW: 12.4 % (ref 11.5–15.5)
WBC: 3.7 10*3/uL — ABNORMAL LOW (ref 4.0–10.5)

## 2015-10-17 LAB — BASIC METABOLIC PANEL
ANION GAP: 9 (ref 5–15)
BUN: 10 mg/dL (ref 6–20)
CHLORIDE: 102 mmol/L (ref 101–111)
CO2: 28 mmol/L (ref 22–32)
Calcium: 8.6 mg/dL — ABNORMAL LOW (ref 8.9–10.3)
Creatinine, Ser: 1.2 mg/dL (ref 0.61–1.24)
GFR calc non Af Amer: 55 mL/min — ABNORMAL LOW (ref >60)
Glucose, Bld: 84 mg/dL (ref 65–99)
Potassium: 3.2 mmol/L — ABNORMAL LOW (ref 3.5–5.1)
Sodium: 139 mmol/L (ref 135–145)

## 2015-10-17 LAB — URINE CULTURE

## 2015-10-17 MED ORDER — FINASTERIDE 5 MG PO TABS
5.0000 mg | ORAL_TABLET | Freq: Every day | ORAL | Status: DC
  Administered 2015-10-17 – 2015-10-19 (×3): 5 mg via ORAL
  Filled 2015-10-17 (×6): qty 1

## 2015-10-17 MED ORDER — TAMSULOSIN HCL 0.4 MG PO CAPS
0.4000 mg | ORAL_CAPSULE | Freq: Every day | ORAL | Status: DC
  Administered 2015-10-17 – 2015-10-19 (×3): 0.4 mg via ORAL
  Filled 2015-10-17 (×3): qty 1

## 2015-10-17 NOTE — Care Management Note (Signed)
Case Management Note  Patient Details  Name: Richard Santiago MRN: [id number] Date of Birth: 1933-03-03  Expected Discharge Date:                  Expected Discharge Plan:  Home/Self Care  In-House Referral:  NA  Discharge planning Services  CM Consult  Post Acute Care Choice:  Durable Medical Equipment Choice offered to:  Spouse  DME Arranged:  Kasandra Knudsen DME Agency:  [place] Arranged:    [facility] Agency:     Status of Service:  In process, will continue to follow  Medicare Important Message Given:    Date Medicare IM Given:    Medicare IM give by:    Date Additional Medicare IM Given:    Additional Medicare Important Message give by:     If discussed at [place] of Stay Meetings, dates discussed:    Additional Comments: Met with pt's wife. States she is a CNA and she does not feel HH will be needed at DC. Wife does say the pt needs a cane and she would like it from [facility]. Order received and Blake Divine, of [facility], made aware of DME referral and will deliver to pt's room. Anticipate DC home on 3/2.   Sherald Barge, RN 10/17/2015, 1:43 PM

## 2015-10-17 NOTE — Progress Notes (Signed)
[facility] PROGRESS NOTE  Richard Santiago AVW:[id number] DOB: Apr 10, 1933 DOA: 10/14/2015 PCP: Ignatius Specking., MD  82 y/o ? Prior kidney stone Sleep apnea on CPAP machine since 2004 Chronic renal insufficiency  Currently Hypertension Ocular TB? [facility] ophthalmologist Dr. Gentry Roch Trigeminal neuralgia 2004 status post gamma knife surgery 2007, good effect and then repeat 09/2011-iron and poker-face effaced-like feeling.  Admitted to [facility] 10/14/15 with rigors, subjective fever, UA showing 6-30 WBCs CT showed evidence of mild bilateral hydronephrosis, enlarged prostate Admitted for pyelonephritis  Assessment/Plan:  Pyelonephritis e.coli -Cx with >100K E Coli. -Patient initially was placed on Zosyn -->rocephin-->Bactrim 10/17/15, stop date 10/18/15  Bilateral Hydronephrosis -Seen on CT scan with dilated bladder. -My partner, Dr. Susie Cassette, discussed case with GU, Dr. Sherryl Barters, who recommended placement of foley catheter to decompress the bladder and consideration to repeat renal US if fever persists.   AKI -Postobstructive. -We will clamp the Foley to see if can void -start Flomax 0.4 mg daily + Finasteride 5 daily to see if makes a diff -Improving--> creatinine on admission 26/1.7-->10/1.2 -DC with foley and GU followup.  Right Trigeminal Neuralgia -Patient with significant pain today. -restart his neurontin 600 5 times a day.  Obstructive sleep apnea using CPAP -continue Management Continue CPAP here  Ocular TB + Chorioretinitis followed at [facility] -OP follow up as per PCP   Code Status: Full Code Family Communication: Discussed with son at bedside and all questions answered.  Disposition Plan: To be determined.   Consultants:  Telephone GU.   Antibiotics:  Rocephin   Subjective:  Slightly sleepy but no other issue tol diet NO cp NO n/v  Objective: Filed Vitals:   10/16/15 1315 10/16/15 2032 10/16/15 2302 10/17/15 0502  BP: 177/90 163/81   172/79  Pulse: 72 65  70  Temp: 98.5 F (36.9 C) 97.8 F (36.6 C)  97.6 F (36.4 C)  TempSrc: Axillary Axillary  Axillary  Resp: Height:      Weight:      SpO2: 99% 99% 98% 97%    Intake/Output Summary (Last 24 hours) at 10/17/15 1059 Last data filed at 10/17/15 0102  Gross per 24 hour  Intake    240 ml  Output   3100 ml  Net  -2860 ml   Filed Weights   10/14/15 1156 10/14/15 2008  Weight: 69.854 kg (154 lb) 67.677 kg (149 lb 3.2 oz)    Exam:   General:  AA  Cardiovascular: RRR  Respiratory: CTA B  Abdomen: S/NT/ND/+BS  Extremities: no C/C/E   Neurologic:  Grossly intact and non-focal  Data Reviewed: Basic Metabolic Panel:  Recent Labs Lab 10/14/15 1246 10/15/15 0531 10/16/15 0551 10/17/15 0547  NA 139 141 140 139  K 3.8 4.0 3.7 3.2*  CL 103 105 105 102  CO2 GLUCOSE 128* 125* 93 84  BUN 26* CREATININE 1.72* 1.44* 1.43* 1.20  CALCIUM 8.5* 8.4* 8.5* 8.6*   Liver Function Tests:  Recent Labs Lab 10/14/15 1246 10/15/15 0531 10/16/15 0551  AST 37 48* 40  ALT 36 33 28  ALKPHOS 91 95 85  BILITOT 1.5* 1.9* 1.1  PROT 6.5 6.7 6.2*  ALBUMIN 3.2* 3.1* 2.8*   No results for input(s): LIPASE, AMYLASE in the last 168 hours. No results for input(s): AMMONIA in the last 168 hours. CBC:  Recent Labs Lab 10/14/15 1246 10/15/15 0531 10/16/15 0551 10/17/15 0547  WBC 7.7 7.1 4.1 3.7*  NEUTROABS 6.2  --   --   --   HGB 10.6* 11.1* 10.3* 11.5*  HCT 32.3* 34.0* 30.6* 35.1*  MCV 89.0 89.2 87.9 86.9  PLT 160 164 152 181   Cardiac Enzymes: No results for input(s): CKTOTAL, CKMB, CKMBINDEX, TROPONINI in the last 168 hours. BNP (last 3 results) No results for input(s): BNP in the last 8760 hours.  ProBNP (last 3 results) No results for input(s): PROBNP in the last 8760 hours.  CBG: No results for input(s): GLUCAP in the last 168 hours.  Recent Results (from the past 240 hour(s))  Urine culture     Status: None    Collection Time: 10/14/15  1:43 PM  Result Value Ref Range Status   Specimen Description URINE, CLEAN CATCH  Final   Special Requests NONE  Final   Culture   Final    >=100,000 COLONIES/mL ESCHERICHIA COLI Performed at [facility]    Report Status 10/17/2015 FINAL  Final   Organism ID, Bacteria ESCHERICHIA COLI  Final      Susceptibility   Escherichia coli - MIC*    AMPICILLIN <=2 SENSITIVE Sensitive     CEFAZOLIN <=4 SENSITIVE Sensitive     CEFTRIAXONE <=1 SENSITIVE Sensitive     CIPROFLOXACIN <=0.25 SENSITIVE Sensitive     GENTAMICIN <=1 SENSITIVE Sensitive     IMIPENEM <=0.25 SENSITIVE Sensitive     NITROFURANTOIN <=16 SENSITIVE Sensitive     TRIMETH/SULFA <=20 SENSITIVE Sensitive     AMPICILLIN/SULBACTAM <=2 SENSITIVE Sensitive     PIP/TAZO <=4 SENSITIVE Sensitive     * >=100,000 COLONIES/mL ESCHERICHIA COLI  Rapid strep screen (not at [facility])     Status: None   Collection Time: 10/14/15  3:33 PM  Result Value Ref Range Status   Streptococcus, Group A Screen (Direct) NEGATIVE NEGATIVE Final    Comment: (NOTE) A Rapid Antigen test may result negative if the antigen level in the sample is below the detection level of this test. The FDA has not cleared this test as a stand-alone test therefore the rapid antigen negative result has reflexed to a Group A Strep culture.   Culture, group A strep     Status: None (Preliminary result)   Collection Time: 10/14/15  3:33 PM  Result Value Ref Range Status   Specimen Description THROAT  Final   Special Requests NONE Reflexed from [id number]  Final   Culture   Final    CULTURE REINCUBATED FOR BETTER GROWTH Performed at [facility]    Report Status PENDING  Incomplete  Blood culture (routine x 2)     Status: None (Preliminary result)   Collection Time: 10/14/15  3:37 PM  Result Value Ref Range Status   Specimen Description BLOOD LEFT WRIST  Final   Special Requests BOTTLES DRAWN AEROBIC AND ANAEROBIC 4CC EACH  Final    Culture NO GROWTH 3 DAYS  Final   Report Status PENDING  Incomplete  Blood culture (routine x 2)     Status: None (Preliminary result)   Collection Time: 10/14/15  3:49 PM  Result Value Ref Range Status   Specimen Description BLOOD LEFT HAND  Final   Special Requests BOTTLES DRAWN AEROBIC AND ANAEROBIC 4CC EACH  Final   Culture NO GROWTH 3 DAYS  Final   Report Status PENDING  Incomplete     Studies: No results found.  Scheduled Meds: . cefTRIAXone (ROCEPHIN)  IV  1 g Intravenous Q24H  . gabapentin  600 mg Oral 5  X Daily  . heparin  5,000 Units Subcutaneous 3 times per day  . latanoprost  1 drop Both Eyes QHS  . LORazepam  0.5 mg Oral BID  . sodium chloride flush  3 mL Intravenous Q12H  . timolol  1 drop Both Eyes Daily   Continuous Infusions: . sodium chloride 100 mL/hr at 10/16/15 1351    Active Problems:   Pyelonephritis   Hyperlipidemia   Sepsis (HCC)   Hematuria   Hydronephrosis   ARF (acute renal failure) (HCC)    Time spent: 25 minutes. Greater than 50% of this time was spent in direct contact with the patient coordinating care.    Pleas Koch, MD [facility] [phone]

## 2015-10-18 DIAGNOSIS — N1330 Unspecified hydronephrosis: Secondary | ICD-10-CM

## 2015-10-18 LAB — CULTURE, GROUP A STREP (THRC)

## 2015-10-18 MED ORDER — SULFAMETHOXAZOLE-TRIMETHOPRIM 800-160 MG PO TABS
1.0000 | ORAL_TABLET | Freq: Two times a day (BID) | ORAL | Status: DC
  Administered 2015-10-18 – 2015-10-19 (×3): 1 via ORAL
  Filled 2015-10-18 (×3): qty 1

## 2015-10-18 NOTE — Progress Notes (Signed)
[facility] PROGRESS NOTE  Richard Santiago ZOX:[id number] DOB: 08-Nov-1932 DOA: 10/14/2015 PCP: Ignatius Specking., MD  82 y/o ? Prior kidney stone Sleep apnea on CPAP machine since 2004 Chronic renal insufficiency  Currently Hypertension Ocular TB? [facility] ophthalmologist Dr. Gentry Roch Trigeminal neuralgia 2004 status post gamma knife surgery 2007, good effect and then repeat 09/2011-iron and poker-face effaced-like feeling.  Admitted to [facility] 10/14/15 with rigors, subjective fever, UA showing 6-30 WBCs CT showed evidence of mild bilateral hydronephrosis, enlarged prostate Admitted for pyelonephritis  Assessment/Plan:  Pyelonephritis e.coli -Cx with >100K E Coli. -Patient initially was placed on Zosyn -->rocephin-->Bactrim 10/18/15, stop date 10/18/15 -rpt labs in am with CBC  Bilateral Hydronephrosis -Seen on CT scan with dilated bladder. -My partner, Dr. Susie Cassette, discussed case with GU, Dr. Sherryl Barters, who recommended placement of foley catheter to decompress the bladder and consideration to repeat renal US if fever persists. -Repeat ultrasound was performed on 10/17/15 in presence of fever but did not show any persisting hydronephrosis or any other findings the bladder was nondistended because of Foley catheter. -monitor overnight-if no further issues can probably d/c home in am -needs to mobilize  AKI -Postobstructive. -start Flomax 0.4 mg daily + Finasteride 5 daily to see if makes a diff -Improving--> creatinine on admission 26/1.7-->10/1.2 -DC with foley and GU followup.  Right Trigeminal Neuralgia -Patient with significant pain today. -restart his neurontin 600 5 times a day.  Obstructive sleep apnea using CPAP -continue Management Continue CPAP here  Ocular TB + Chorioretinitis followed at [facility] -OP follow up as per PCP   Code Status: Full Code Family Communication: Discussed with son at bedside and all questions answered.  Disposition Plan: To be  determined.   Consultants:  Telephone GU.   Antibiotics:  Rocephin   Subjective:  Fever yesterday 102 blood cultures drawn. Still has catheter in place Sleepy at the bedside No other issues.   Objective: Filed Vitals:   10/17/15 1553 10/17/15 1837 10/17/15 2020 10/18/15 0536  BP: 146/68  116/50 169/70  Pulse: 78  62 63  Temp: 102.9 F (39.4 C) 98.4 F (36.9 C) 98.7 F (37.1 C) 98.4 F (36.9 C)  TempSrc: Oral Oral Oral Oral  Resp: Height:      Weight:      SpO2: 98%  99% 98%    Intake/Output Summary (Last 24 hours) at 10/18/15 1024 Last data filed at 10/18/15 0528  Gross per 24 hour  Intake 2323.33 ml  Output   1950 ml  Net 373.33 ml   Filed Weights   10/14/15 1156 10/14/15 2008  Weight: 69.854 kg (154 lb) 67.677 kg (149 lb 3.2 oz)    Exam:   General:  AA  Cardiovascular: RRR  Respiratory: CTA B  Abdomen: S/NT/ND/+BS  Extremities: no C/C/E   Neurologic:  Grossly intact and non-focal  Data Reviewed: Basic Metabolic Panel:  Recent Labs Lab 10/14/15 1246 10/15/15 0531 10/16/15 0551 10/17/15 0547  NA 139 141 140 139  K 3.8 4.0 3.7 3.2*  CL 103 105 105 102  CO2 GLUCOSE 128* 125* 93 84  BUN 26* CREATININE 1.72* 1.44* 1.43* 1.20  CALCIUM 8.5* 8.4* 8.5* 8.6*   Liver Function Tests:  Recent Labs Lab 10/14/15 1246 10/15/15 0531 10/16/15 0551  AST 37 48* 40  ALT 36 33 28  ALKPHOS 91 95 85  BILITOT 1.5* 1.9* 1.1  PROT 6.5 6.7 6.2*  ALBUMIN 3.2*  3.1* 2.8*   No results for input(s): LIPASE, AMYLASE in the last 168 hours. No results for input(s): AMMONIA in the last 168 hours. CBC:  Recent Labs Lab 10/14/15 1246 10/15/15 0531 10/16/15 0551 10/17/15 0547  WBC 7.7 7.1 4.1 3.7*  NEUTROABS 6.2  --   --   --   HGB 10.6* 11.1* 10.3* 11.5*  HCT 32.3* 34.0* 30.6* 35.1*  MCV 89.0 89.2 87.9 86.9  PLT 160 164 152 181   Cardiac Enzymes: No results for input(s): CKTOTAL, CKMB, CKMBINDEX, TROPONINI  in the last 168 hours. BNP (last 3 results) No results for input(s): BNP in the last 8760 hours.  ProBNP (last 3 results) No results for input(s): PROBNP in the last 8760 hours.  CBG: No results for input(s): GLUCAP in the last 168 hours.  Recent Results (from the past 240 hour(s))  Urine culture     Status: None   Collection Time: 10/14/15  1:43 PM  Result Value Ref Range Status   Specimen Description URINE, CLEAN CATCH  Final   Special Requests NONE  Final   Culture   Final    >=100,000 COLONIES/mL ESCHERICHIA COLI Performed at [facility]    Report Status 10/17/2015 FINAL  Final   Organism ID, Bacteria ESCHERICHIA COLI  Final      Susceptibility   Escherichia coli - MIC*    AMPICILLIN <=2 SENSITIVE Sensitive     CEFAZOLIN <=4 SENSITIVE Sensitive     CEFTRIAXONE <=1 SENSITIVE Sensitive     CIPROFLOXACIN <=0.25 SENSITIVE Sensitive     GENTAMICIN <=1 SENSITIVE Sensitive     IMIPENEM <=0.25 SENSITIVE Sensitive     NITROFURANTOIN <=16 SENSITIVE Sensitive     TRIMETH/SULFA <=20 SENSITIVE Sensitive     AMPICILLIN/SULBACTAM <=2 SENSITIVE Sensitive     PIP/TAZO <=4 SENSITIVE Sensitive     * >=100,000 COLONIES/mL ESCHERICHIA COLI  Rapid strep screen (not at [facility])     Status: None   Collection Time: 10/14/15  3:33 PM  Result Value Ref Range Status   Streptococcus, Group A Screen (Direct) NEGATIVE NEGATIVE Final    Comment: (NOTE) A Rapid Antigen test may result negative if the antigen level in the sample is below the detection level of this test. The FDA has not cleared this test as a stand-alone test therefore the rapid antigen negative result has reflexed to a Group A Strep culture.   Culture, group A strep     Status: None (Preliminary result)   Collection Time: 10/14/15  3:33 PM  Result Value Ref Range Status   Specimen Description THROAT  Final   Special Requests NONE Reflexed from [id number]  Final   Culture   Final    CULTURE REINCUBATED FOR BETTER  GROWTH Performed at [facility]    Report Status PENDING  Incomplete  Blood culture (routine x 2)     Status: None (Preliminary result)   Collection Time: 10/14/15  3:37 PM  Result Value Ref Range Status   Specimen Description BLOOD LEFT WRIST  Final   Special Requests BOTTLES DRAWN AEROBIC AND ANAEROBIC 4CC EACH  Final   Culture NO GROWTH 4 DAYS  Final   Report Status PENDING  Incomplete  Blood culture (routine x 2)     Status: None (Preliminary result)   Collection Time: 10/14/15  3:49 PM  Result Value Ref Range Status   Specimen Description BLOOD LEFT HAND  Final   Special Requests BOTTLES DRAWN AEROBIC AND ANAEROBIC [facility] EACH  Final  Culture NO GROWTH 4 DAYS  Final   Report Status PENDING  Incomplete  Culture, blood (Routine X 2) w Reflex to ID Panel     Status: None (Preliminary result)   Collection Time: 10/17/15  4:45 PM  Result Value Ref Range Status   Specimen Description BLOOD LEFT FOREARM  Final   Special Requests BOTTLES DRAWN AEROBIC AND ANAEROBIC 10CC  Final   Culture NO GROWTH < 24 HOURS  Final   Report Status PENDING  Incomplete  Culture, blood (Routine X 2) w Reflex to ID Panel     Status: None (Preliminary result)   Collection Time: 10/17/15  4:53 PM  Result Value Ref Range Status   Specimen Description BLOOD RIGHT HAND  Final   Special Requests BOTTLES DRAWN AEROBIC AND ANAEROBIC 8CC  Final   Culture NO GROWTH < 24 HOURS  Final   Report Status PENDING  Incomplete     Studies: US Renal  10/17/2015  CLINICAL DATA:  Pyelonephritis. History of kidney stones. Follow-up exam. EXAM: RENAL / URINARY TRACT ULTRASOUND COMPLETE COMPARISON:  Renal ultrasound, 10/15/2015 FINDINGS: Right Kidney: Length: 10.4 cm. Increased parenchymal echogenicity. Small lower pole cyst measuring 14 mm. No other masses. No hydronephrosis. Left Kidney: Length: 9.7 cm. Increased parenchymal echogenicity. 4 cm cyst arising from the mid to lower pole. No other renal masses. No  hydronephrosis. Trace perinephric edema. Bladder: Foley catheter. No residual fluid in the bladder, but there is surrounding intermediate echogenicity material consistent with the bladder wall and the debris noted in the bladder on the prior study. Bladder not well evaluated due to the presence of the Foley catheter and lack of distension. IMPRESSION: 1. No hydronephrosis. 2. Increased parenchymal echogenicity in the kidneys consistent with medical renal disease as described on the prior study. 3. Small bilateral renal cysts, simple in appearance. Electronically Signed   By: Amie Portland M.D.   On: 10/17/2015 17:04    Scheduled Meds: . cefTRIAXone (ROCEPHIN)  IV  1 g Intravenous Q24H  . finasteride  5 mg Oral Daily  . gabapentin  600 mg Oral 5 X Daily  . heparin  5,000 Units Subcutaneous 3 times per day  . latanoprost  1 drop Both Eyes QHS  . LORazepam  0.5 mg Oral BID  . sodium chloride flush  3 mL Intravenous Q12H  . tamsulosin  0.4 mg Oral Daily  . timolol  1 drop Both Eyes Daily   Continuous Infusions: . sodium chloride 100 mL/hr at 10/18/15 7846    Active Problems:   Pyelonephritis   Hyperlipidemia   Sepsis (HCC)   Hematuria   Hydronephrosis   ARF (acute renal failure) (HCC)    Time spent: 25 minutes. Greater than 50% of this time was spent in direct contact with the patient coordinating care.    Pleas Koch, MD [facility] [phone]

## 2015-10-19 DIAGNOSIS — R319 Hematuria, unspecified: Secondary | ICD-10-CM

## 2015-10-19 LAB — CBC
HEMATOCRIT: 28.3 % — AB (ref 39.0–52.0)
HEMOGLOBIN: 9.7 g/dL — AB (ref 13.0–17.0)
MCH: 29.8 pg (ref 26.0–34.0)
MCHC: 34.3 g/dL (ref 30.0–36.0)
MCV: 86.8 fL (ref 78.0–100.0)
Platelets: 227 10*3/uL (ref 150–400)
RBC: 3.26 MIL/uL — AB (ref 4.22–5.81)
RDW: 12.5 % (ref 11.5–15.5)
WBC: 5.2 10*3/uL (ref 4.0–10.5)

## 2015-10-19 LAB — CULTURE, BLOOD (ROUTINE X 2)
CULTURE: NO GROWTH
CULTURE: NO GROWTH

## 2015-10-19 MED ORDER — TAMSULOSIN HCL 0.4 MG PO CAPS: 0.4000 mg | ORAL_CAPSULE | Freq: Every day | ORAL | Status: AC

## 2015-10-19 MED ORDER — FINASTERIDE 5 MG PO TABS: 5.0000 mg | ORAL_TABLET | Freq: Every day | ORAL | Status: AC

## 2015-10-19 NOTE — Care Management Important Message (Signed)
Important Message  Patient Details  Name: Lannette Donathlvin E Abebe MRN: [id number] Date of Birth: 1932-12-25   Medicare Important Message Given:  Yes    Malcolm MetroChildress, Urania Pearlman Demske, RN 10/19/2015, 3:19 PM

## 2015-10-19 NOTE — Discharge Summary (Signed)
Physician Discharge Summary  Richard Santiago JME:[id number] DOB: June 20, 1933 DOA: 10/14/2015  PCP: Glenda Chroman., MD  Admit date: 10/14/2015 Discharge date: 10/19/2015  Time spent: 45 minutes  Recommendations for Outpatient Follow-up:  1. Needs to follow-up with urologist-started Proscar as well as Flomax this admission 2. Will need indwelling Foley on discharge and teaching regarding leg bag and Foley catheter 3. Recommend be met and CBC in about one week 4. Should follow up with [facility] neurology as well as Dr. Krista Blue regarding trigeminal neuralgia 5. Home health has been ordered and patient will get a cane   Discharge Diagnoses:  Active Problems:   Pyelonephritis   Hyperlipidemia   Sepsis ([place])   Hematuria   Hydronephrosis   ARF (acute renal failure) ([place])   Discharge Condition: Fair  Diet recommendation: Heart healthy  Filed Weights   10/14/15 1156 10/14/15 2008  Weight: 69.854 kg (154 lb) 67.677 kg (149 lb 3.2 oz)    History of present illness:  82 y/o ? Prior kidney stone Sleep apnea on CPAP machine since 2004 Chronic renal insufficiency   Currently Hypertension Ocular TB? [facility] ophthalmologist Dr. Jolyn Nap Trigeminal neuralgia 2004 status post gamma knife surgery 2007, good effect and then repeat 09/2011-iron and poker-face effaced-like feeling.  Admitted to [facility] 10/14/15 with rigors, subjective fever, UA showing 6-30 WBCs CT showed evidence of mild bilateral hydronephrosis, enlarged prostate Admitted for pyelonephritis    Hospital Course:  Pyelonephritis e.coli -Cx with >100K E Coli. -Patient initially was placed on Zosyn -->rocephin-->Bactrim 10/18/15, stop date 10/18/15 and completed course of antibiotics in the hospital -rpt labs in am with CBC and fever curve were negative so antibiotics discontinued  Bilateral Hydronephrosis -Seen on CT scan with dilated bladder. -My partner, Dr. Allyson Sabal, discussed case with GU, Dr. Pilar Jarvis, who recommended  placement of foley catheter to decompress the bladder and consideration to repeat renal US if fever persists. -Repeat ultrasound was performed on 10/17/15 in presence of fever but did not show any persisting hydronephrosis or any other findings the bladder was nondistended because of Foley catheter. -monitor overnight-if no further issues can probably d/c home in am -needs to mobilize -Patient will be discharged home with Foley catheter as well as with appointment for urologist as an outpatient.  AKI -Postobstructive. -start Flomax 0.4 mg daily + Finasteride 5 daily to see if makes a diff -Improving--> creatinine on admission 26/1.7-->10/1.2 -DC with foley and GU followup. Recommend basic metabolic panel as an outpatient-  Right Trigeminal Neuralgia -Patient with significant pain today. -restart his neurontin 600 5 times a day.  Obstructive sleep apnea using CPAP -continue Management Continue CPAP here  Ocular TB + Chorioretinitis followed at [place] follow up as per PCP   Procedures:  Ultrasound kidney 2    Consultations:  Urology telephone consulted this admission   Discharge Exam: Filed Vitals:   10/18/15 2027 10/19/15 0556  BP: 120/65 145/73  Pulse: 64 74  Temp: 98.1 F (36.7 C) 98.6 F (37 C)  Resp: 20 20    doing stable. Sitting in bed A little bit more alert  General: EOMI NCATS1-S2 no murmur rub or gallo: Clinically clear no cell   Discharge Instructions    Current Discharge Medication List    START taking these medications   Details  finasteride (PROSCAR) 5 MG tablet Take 1 tablet (5 mg total) by mouth daily. Qty: 30 tablet, Refills: 0    tamsulosin (FLOMAX) 0.4 MG CAPS capsule Take 1 capsule (0.4 mg total) by  mouth daily. Qty: 30 capsule, Refills: 0      CONTINUE these medications which have NOT CHANGED   Details  !! amLODipine (NORVASC) 10 MG tablet Take 10 mg by mouth daily. Take in addition to 59m tab - total 137mdaily.    !!  amLODipine (NORVASC) 5 MG tablet Take 5 mg by mouth daily. Take in addition to 1031mablet - total 19m75mily.    gabapentin (NEURONTIN) 600 MG tablet Take 1 tablet (600 mg total) by mouth 5 (five) times daily. Take one tab three times daily and two tablets at bedtime. Qty: 150 tablet, Refills: 11    latanoprost (XALATAN) 0.005 % ophthalmic solution Place 1 drop into both eyes at bedtime.     LORazepam (ATIVAN) 0.5 MG tablet Take 1 tablet by mouth 2 (two) times daily.    Multiple Vitamin (MULTIVITAMIN) tablet Take 1 tablet by mouth daily.    timolol (TIMOPTIC) 0.5 % ophthalmic solution Place 1 drop into both eyes daily.      !! - Potential duplicate medications found. Please discuss with provider.    STOP taking these medications     fentaNYL (DURAGESIC) 25 MCG/HR patch        Allergies  Allergen Reactions  . Gabapentin Other (See Comments)    Fatigue, confusion  . Lyrica [Pregabalin] Other (See Comments)    Fatigue  . Quinapril Swelling    Fatigue  . Ciprofloxacin Rash  . Tegretol [Carbamazepine] Rash  . Trileptal [Oxcarbazepine] Rash      The results of significant diagnostics from this hospitalization (including imaging, microbiology, ancillary and laboratory) are listed below for reference.    Significant Diagnostic Studies: Dg Chest 2 View  10/14/2015  CLINICAL DATA:  Fever, flu-like symptoms. EXAM: CHEST  2 VIEW COMPARISON:  06/07/2011 FINDINGS: Heart and mediastinal contours are within normal limits. No focal opacities or effusions. No acute bony abnormality. IMPRESSION: No active cardiopulmonary disease. Electronically Signed   By: KeviRolm Baptise.   On: 10/14/2015 13:49   [place]  10/17/2015  CLINICAL DATA:  Pyelonephritis. History of kidney stones. Follow-up exam. EXAM: RENAL / URINARY TRACT ULTRASOUND COMPLETE COMPARISON:  Renal ultrasound, 10/15/2015 FINDINGS: Right Kidney: Length: 10.4 cm. Increased parenchymal echogenicity. Small lower pole cyst measuring 14  mm. No other masses. No hydronephrosis. Left Kidney: Length: 9.7 cm. Increased parenchymal echogenicity. 4 cm cyst arising from the mid to lower pole. No other renal masses. No hydronephrosis. Trace perinephric edema. Bladder: Foley catheter. No residual fluid in the bladder, but there is surrounding intermediate echogenicity material consistent with the bladder wall and the debris noted in the bladder on the prior study. Bladder not well evaluated due to the presence of the Foley catheter and lack of distension. IMPRESSION: 1. No hydronephrosis. 2. Increased parenchymal echogenicity in the kidneys consistent with medical renal disease as described on the prior study. 3. Small bilateral renal cysts, simple in appearance. Electronically Signed   By: DaviLajean Manes.   On: 10/17/2015 17:04   Us [place]  10/15/2015  CLINICAL DATA:  Acute pyelonephritis EXAM: RENAL / URINARY TRACT ULTRASOUND COMPLETE COMPARISON:  10/14/2015 FINDINGS: Right Kidney: Length: 10.4 cm. Echogenic parenchyma compared to adjacent liver. No hydronephrosis. Left Kidney: Length: 10.6 cm. No hydronephrosis. Partially exophytic 3.4 x 3 x 3.6 cm lower pole cyst. Bladder: Partially decompressed by Foley catheter. Low level echoes in the lumen. IMPRESSION: 1. Resolution of  hydronephrosis since previous CT . 2. Echogenic renal parenchyma, a nonspecific indicator of medical  renal disease. 3. Benign-appearing left renal cyst, seen previously. 4. Low level echoes in the lumen of the incompletely distended urinary bladder, with Foley catheter. Electronically Signed   By: Lucrezia Europe M.D.   On: 10/15/2015 12:05   Ct Renal Stone Study  10/14/2015  CLINICAL DATA:  Patient with hematuria. Fever and chills. History of renal stones. EXAM: CT ABDOMEN AND PELVIS WITHOUT CONTRAST TECHNIQUE: Multidetector CT imaging of the abdomen and pelvis was performed following the standard protocol without IV contrast. COMPARISON:  None. FINDINGS: Lower chest: Normal heart  size. There are consolidative opacities within the bilateral lower lobes. No pleural effusion. Hepatobiliary: Liver is normal in size and contour. Liver is diffusely low in attenuation. Gallbladder is unremarkable. Pancreas: Unremarkable Spleen: Unremarkable Adrenals/Urinary Tract: Mild bilateral adrenal thickening. The urinary bladder is markedly distended. There is mild bilateral hydroureteronephrosis. There is a 3.3 cm cyst off the inferior pole of the left kidney. There is a 1.7 cm cyst within the inferior pole of the right kidney. Extensive fat stranding about the right kidney and proximal right ureter. Mild stranding about the left kidney. Stomach/Bowel: No abnormal bowel wall thickening or evidence for bowel obstruction. No free intraperitoneal air. Small hiatal hernia. Vascular/Lymphatic: Normal caliber abdominal aorta. Peripheral calcified atherosclerotic plaque. No retroperitoneal lymphadenopathy. Other: Prostate enlarged. Musculoskeletal: Lumbar spine degenerative changes. No aggressive or acute appearing osseous lesions. IMPRESSION: There is mild bilateral hydronephrosis, left-greater-than-right. There is extensive fat stranding about the right kidney and proximal right ureter which is nonspecific in etiology however may be secondary to pyelonephritis in the appropriate clinical setting. The urinary bladder is massively distended. Recommend decompression with Foley catheter. Electronically Signed   By: Lovey Newcomer M.D.   On: 10/14/2015 17:13    Microbiology: Recent Results (from the past 240 hour(s))  Urine culture     Status: None   Collection Time: 10/14/15  1:43 PM  Result Value Ref Range Status   Specimen Description URINE, CLEAN CATCH  Final   Special Requests NONE  Final   Culture   Final    >=100,000 COLONIES/mL ESCHERICHIA COLI Performed at [facility]    Report Status 10/17/2015 FINAL  Final   Organism ID, Bacteria ESCHERICHIA COLI  Final      Susceptibility    Escherichia coli - MIC*    AMPICILLIN <=2 SENSITIVE Sensitive     CEFAZOLIN <=4 SENSITIVE Sensitive     CEFTRIAXONE <=1 SENSITIVE Sensitive     CIPROFLOXACIN <=0.25 SENSITIVE Sensitive     GENTAMICIN <=1 SENSITIVE Sensitive     IMIPENEM <=0.25 SENSITIVE Sensitive     NITROFURANTOIN <=16 SENSITIVE Sensitive     TRIMETH/SULFA <=20 SENSITIVE Sensitive     AMPICILLIN/SULBACTAM <=2 SENSITIVE Sensitive     PIP/TAZO <=4 SENSITIVE Sensitive     * >=100,000 COLONIES/mL ESCHERICHIA COLI  Rapid strep screen (not at [facility])     Status: None   Collection Time: 10/14/15  3:33 PM  Result Value Ref Range Status   Streptococcus, Group A Screen (Direct) NEGATIVE NEGATIVE Final    Comment: (NOTE) A Rapid Antigen test may result negative if the antigen level in the sample is below the detection level of this test. The FDA has not cleared this test as a stand-alone test therefore the rapid antigen negative result has reflexed to a Group A Strep culture.   Culture, group A strep     Status: None   Collection Time: 10/14/15  3:33 PM  Result Value Ref Range Status  Specimen Description THROAT  Final   Special Requests NONE Reflexed from [id number]  Final   Culture   Final    NO GROUP A STREP (S.PYOGENES) ISOLATED Performed at [facility]    Report Status 10/18/2015 FINAL  Final  Blood culture (routine x 2)     Status: None   Collection Time: 10/14/15  3:37 PM  Result Value Ref Range Status   Specimen Description BLOOD LEFT WRIST  Final   Special Requests BOTTLES DRAWN AEROBIC AND ANAEROBIC 4CC EACH  Final   Culture NO GROWTH 5 DAYS  Final   Report Status 10/19/2015 FINAL  Final  Blood culture (routine x 2)     Status: None   Collection Time: 10/14/15  3:49 PM  Result Value Ref Range Status   Specimen Description BLOOD LEFT HAND  Final   Special Requests BOTTLES DRAWN AEROBIC AND ANAEROBIC 4CC EACH  Final   Culture NO GROWTH 5 DAYS  Final   Report Status 10/19/2015 FINAL  Final  Culture,  blood (Routine X 2) w Reflex to ID Panel     Status: None (Preliminary result)   Collection Time: 10/17/15  4:45 PM  Result Value Ref Range Status   Specimen Description BLOOD LEFT FOREARM  Final   Special Requests BOTTLES DRAWN AEROBIC AND ANAEROBIC 10CC  Final   Culture NO GROWTH 2 DAYS  Final   Report Status PENDING  Incomplete  Culture, blood (Routine X 2) w Reflex to ID Panel     Status: None (Preliminary result)   Collection Time: 10/17/15  4:53 PM  Result Value Ref Range Status   Specimen Description BLOOD RIGHT HAND  Final   Special Requests BOTTLES DRAWN AEROBIC AND ANAEROBIC 8CC  Final   Culture NO GROWTH 2 DAYS  Final   Report Status PENDING  Incomplete     Labs: Basic Metabolic Panel:  Recent Labs Lab 10/14/15 1246 10/15/15 0531 10/16/15 0551 10/17/15 0547  NA 139 141 140 139  K 3.8 4.0 3.7 3.2*  CL 103 105 105 102  CO2 _0 GLUCOSE 128* 125* 93 84  BUN 26* _1 CREATININE 1.72* 1.44* 1.43* 1.20  CALCIUM 8.5* 8.4* 8.5* 8.6*   Liver Function Tests:  Recent Labs Lab 10/14/15 1246 10/15/15 0531 10/16/15 0551  AST 37 48* 40  ALT 36 33 28  ALKPHOS 91 95 85  BILITOT 1.5* 1.9* 1.1  PROT 6.5 6.7 6.2*  ALBUMIN 3.2* 3.1* 2.8*   No results for input(s): LIPASE, AMYLASE in the last 168 hours. No results for input(s): AMMONIA in the last 168 hours. CBC:  Recent Labs Lab 10/14/15 1246 10/15/15 0531 10/16/15 0551 10/17/15 0547 10/19/15 0545  WBC 7.7 7.1 4.1 3.7* 5.2  NEUTROABS 6.2  --   --   --   --   HGB 10.6* 11.1* 10.3* 11.5* 9.7*  HCT 32.3* 34.0* 30.6* 35.1* 28.3*  MCV 89.0 89.2 87.9 86.9 86.8  PLT 160 164 152 181 227   Cardiac Enzymes: No results for input(s): CKTOTAL, CKMB, CKMBINDEX, TROPONINI in the last 168 hours. BNP: BNP (last 3 results) No results for input(s): BNP in the last 8760 hours.  ProBNP (last 3 results) No results for input(s): PROBNP in the last 8760 hours.  CBG: No results for input(s): GLUCAP in the last  168 hours.     SignedNita Sells MD   [facility] 10/19/2015, 10:02 AM

## 2015-10-19 NOTE — Care Management Note (Signed)
Case Management Note  Patient Details  Name: Richard Santiago MRN: [id number] Date of Birth: August 14, 1933  Expected Discharge Date:                  Expected Discharge Plan:  Home/Self Care  In-House Referral:  NA  Discharge planning Services  CM Consult  Post Acute Care Choice:  Durable Medical Equipment Choice offered to:  Spouse  DME Arranged:  [facility] D[facility] Agency:  [facility]  [facility]     Status of Service:  Completed, signed off  Medicare Important Message Given:    yes Date Medicare IM Given:    Medicare IM give by:    Date Additional Medicare IM Given:    Additional Medicare Important Message give by:     If discussed at Long Length of Stay Meetings, dates discussed:    Additional Comments: Pt discharged home today with self care and family support. Pt's cane has been delivered to room. Pt's wife asking about CPAP for use at home. Pt's last sleep study >5 years ago per pt's wife. Informed wife that medicare will requires sleep study for CPAP. She will discuss getting another sleep study with pt's PCP.   [name], [name], RN 10/19/2015, 3:18 PM

## 2015-10-22 LAB — CULTURE, BLOOD (ROUTINE X 2)
CULTURE: NO GROWTH
Culture: NO GROWTH

## 2015-11-02 ENCOUNTER — Ambulatory Visit (INDEPENDENT_AMBULATORY_CARE_PROVIDER_SITE_OTHER): Payer: Medicare PPO | Admitting: Urology

## 2015-11-02 DIAGNOSIS — Z87440 Personal history of urinary (tract) infections: Secondary | ICD-10-CM

## 2015-11-02 DIAGNOSIS — N401 Enlarged prostate with lower urinary tract symptoms: Secondary | ICD-10-CM | POA: Diagnosis not present

## 2015-11-02 DIAGNOSIS — R338 Other retention of urine: Secondary | ICD-10-CM

## 2015-11-06 ENCOUNTER — Ambulatory Visit (INDEPENDENT_AMBULATORY_CARE_PROVIDER_SITE_OTHER): Payer: Medicare PPO | Admitting: Urology

## 2015-11-06 ENCOUNTER — Other Ambulatory Visit: Payer: Self-pay | Admitting: Urology

## 2015-11-06 DIAGNOSIS — Z87448 Personal history of other diseases of urinary system: Secondary | ICD-10-CM

## 2015-11-06 DIAGNOSIS — N401 Enlarged prostate with lower urinary tract symptoms: Secondary | ICD-10-CM

## 2016-01-02 ENCOUNTER — Ambulatory Visit (HOSPITAL_COMMUNITY)
Admission: RE | Admit: 2016-01-02 | Discharge: 2016-01-02 | Disposition: A | Discharge status: Home / Self Care | Payer: Medicare PPO | Source: Ambulatory Visit | Attending: Urology | Admitting: Urology

## 2016-01-02 DIAGNOSIS — N281 Cyst of kidney, acquired: Secondary | ICD-10-CM | POA: Insufficient documentation

## 2016-01-02 DIAGNOSIS — R93429 Abnormal radiologic findings on diagnostic imaging of unspecified kidney: Secondary | ICD-10-CM | POA: Diagnosis not present

## 2016-01-02 DIAGNOSIS — N329 Bladder disorder, unspecified: Secondary | ICD-10-CM | POA: Diagnosis not present

## 2016-01-02 DIAGNOSIS — Z87448 Personal history of other diseases of urinary system: Secondary | ICD-10-CM

## 2016-01-02 DIAGNOSIS — Z87440 Personal history of urinary (tract) infections: Secondary | ICD-10-CM | POA: Diagnosis not present

## 2016-01-11 ENCOUNTER — Ambulatory Visit: Payer: Medicare PPO | Admitting: Urology

## 2017-04-29 IMAGING — DX DG CHEST 2V
2 series · 2 of 2 positions shown · non-contrast
Comparison: 06/07/2011

CLINICAL DATA: Fever, flu-like symptoms.

EXAM:
CHEST  2 VIEW

[chest lat]
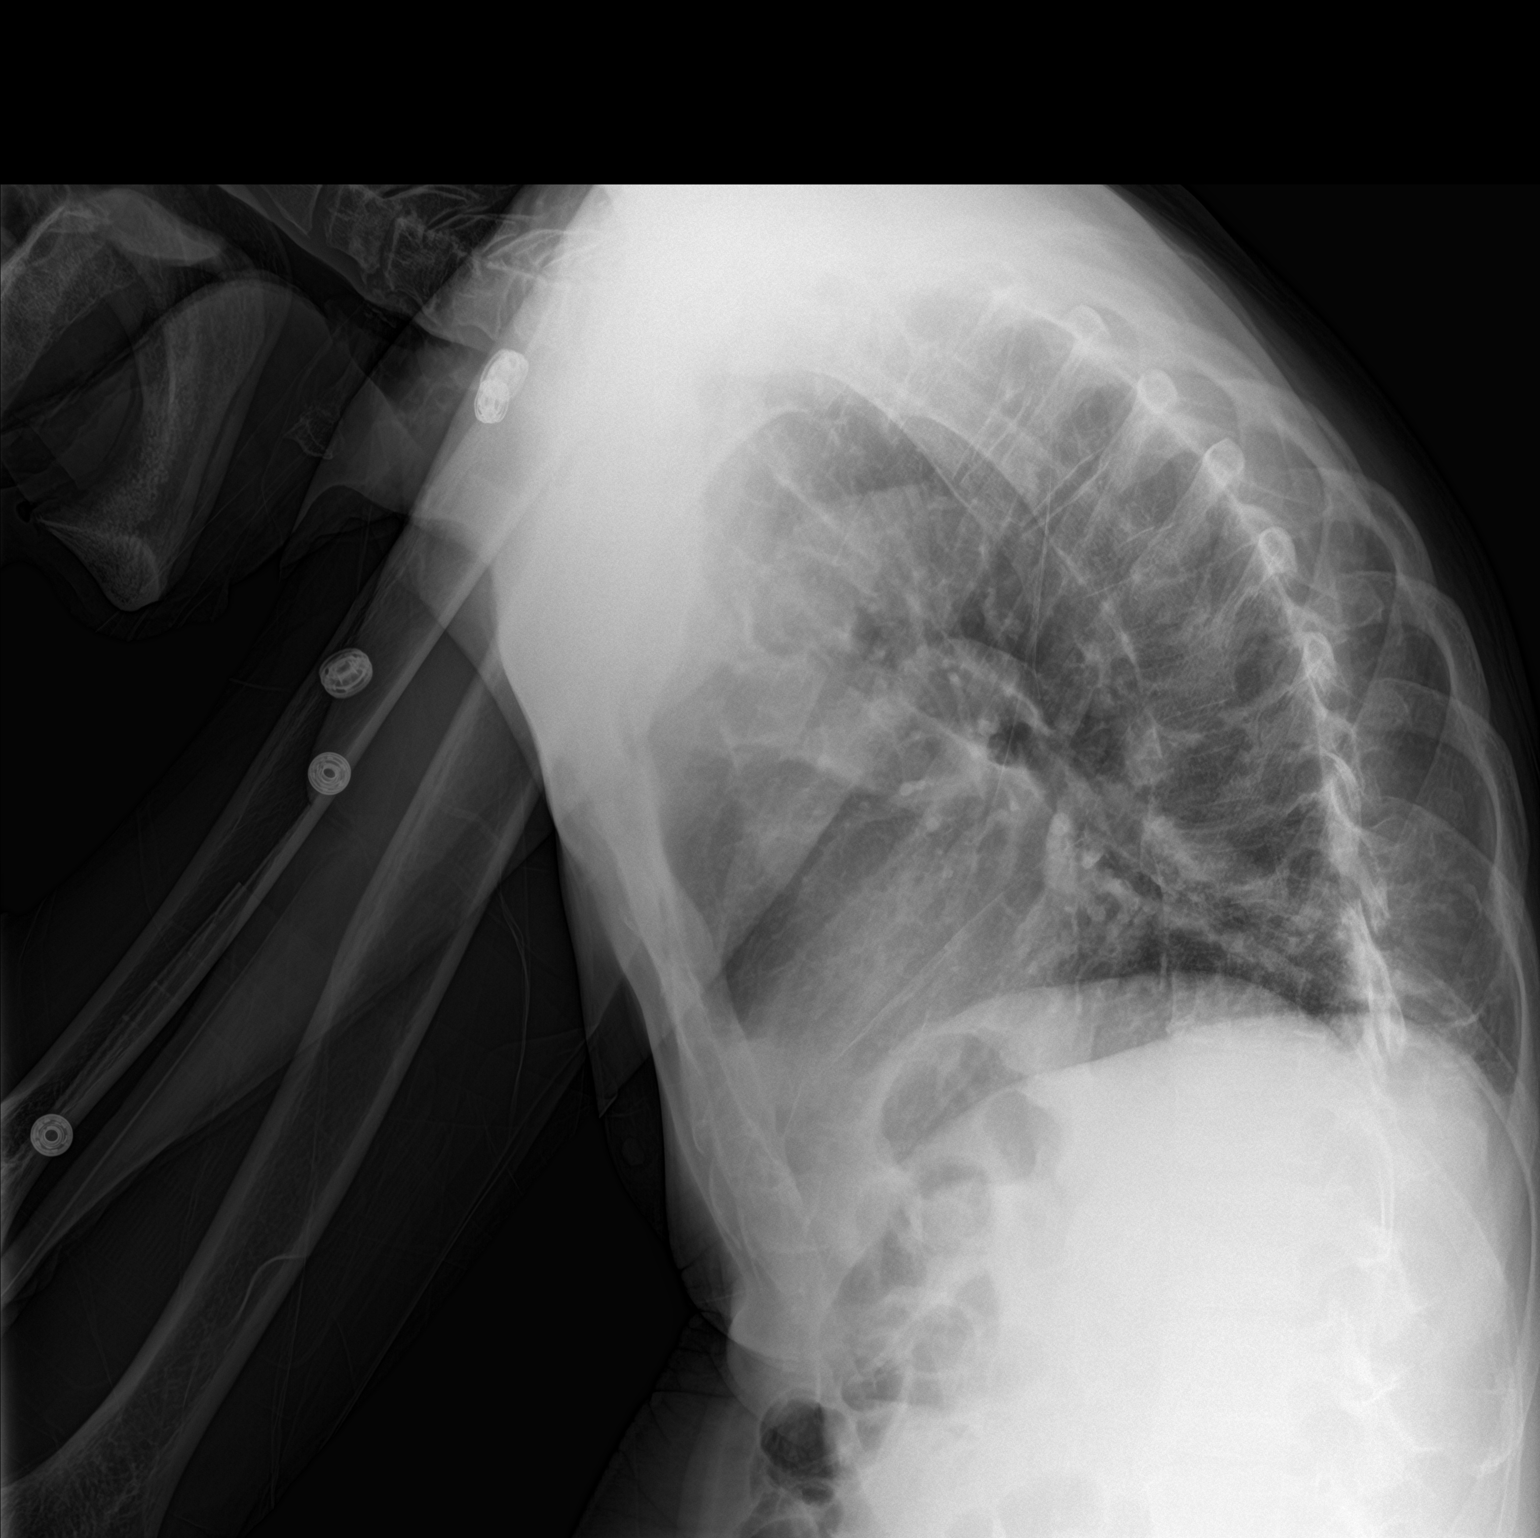

[chest ap]
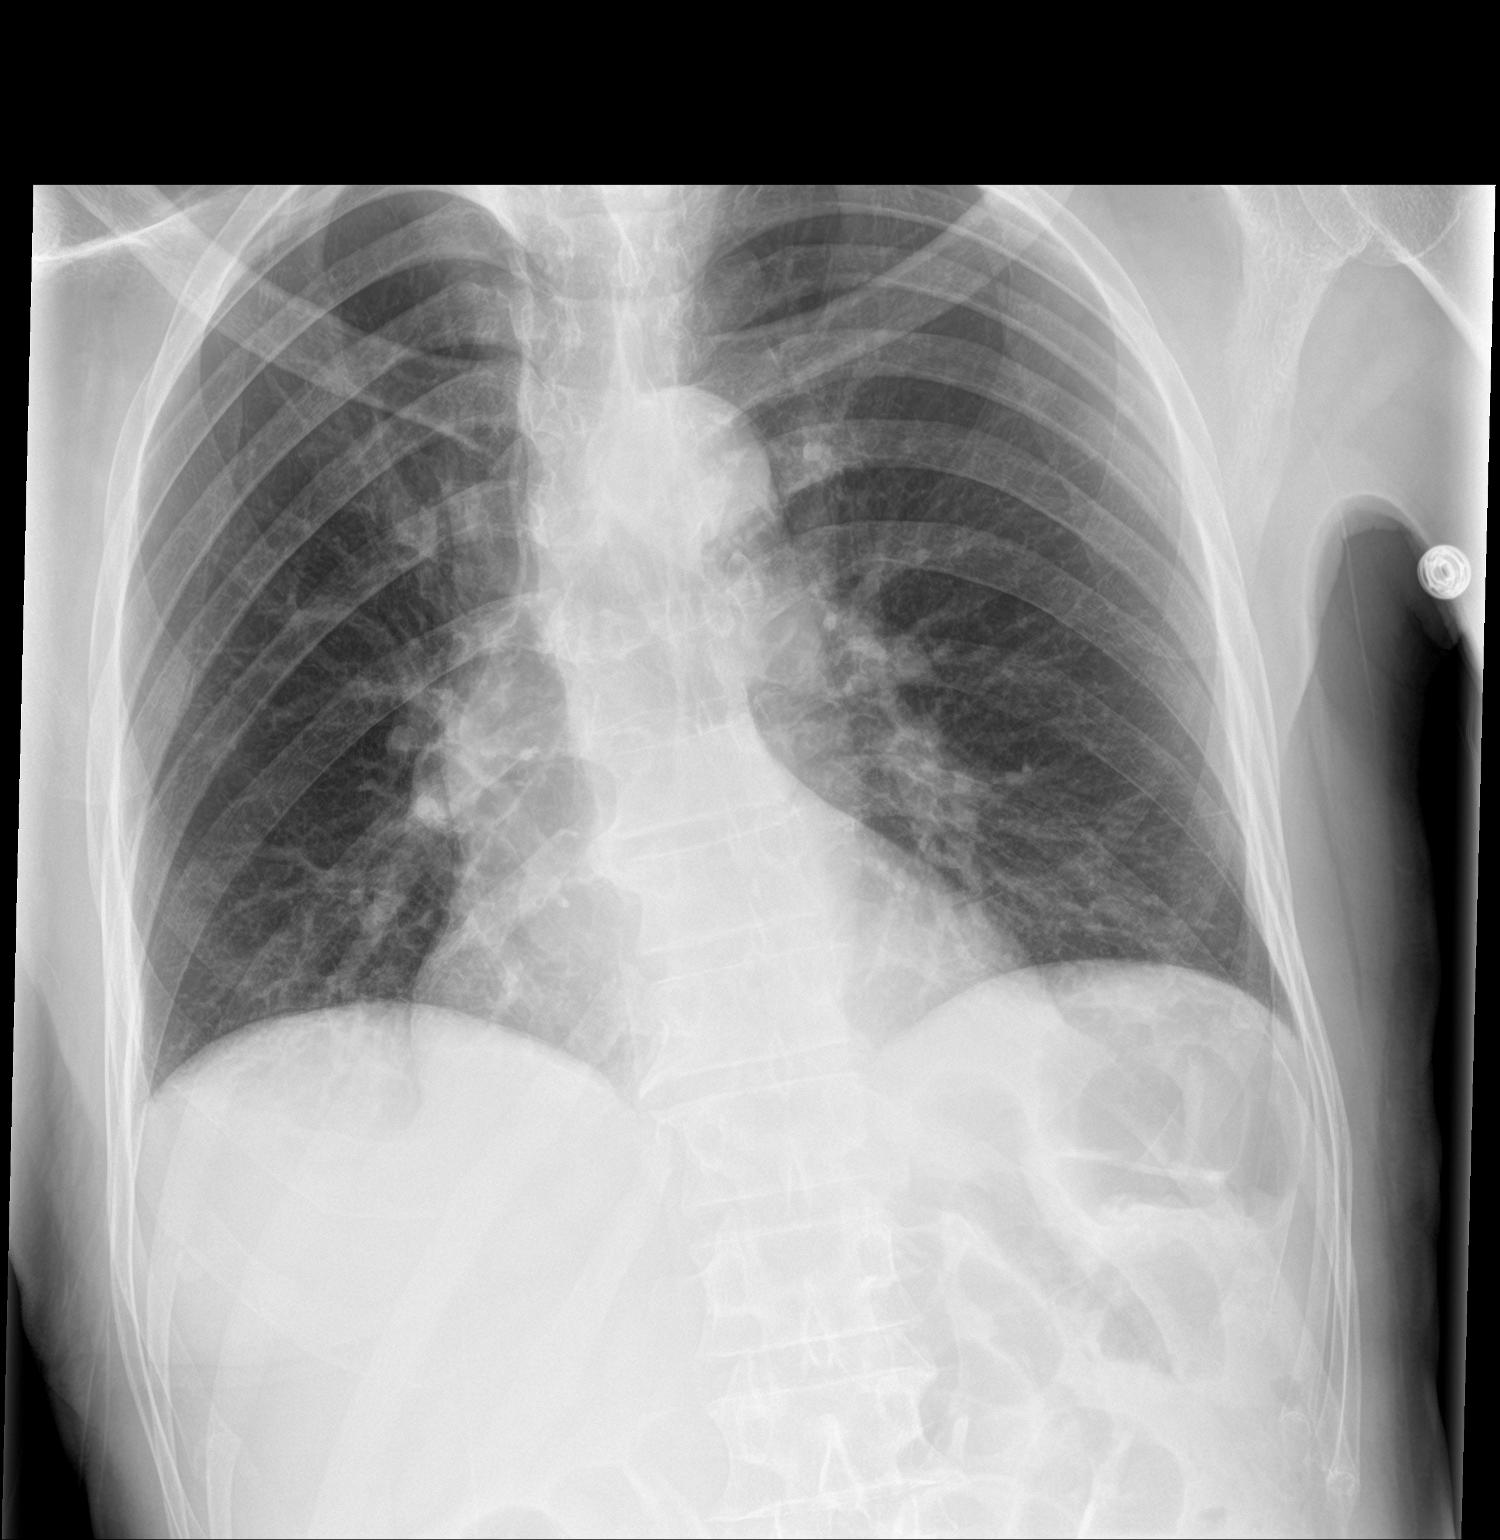

[2 of 2 positions shown; findings below may reference images not displayed]

FINDINGS: Heart and mediastinal contours are within normal limits. No focal
opacities or effusions. No acute bony abnormality.
IMPRESSION: No active cardiopulmonary disease.

## 2017-04-29 IMAGING — CT CT RENAL STONE PROTOCOL
2 of 4 series · 16 of 46 positions shown, 18 images · non-contrast
Comparison: None.

CLINICAL DATA: Patient with hematuria. Fever and chills. History of
renal stones.

EXAM:
CT ABDOMEN AND PELVIS WITHOUT CONTRAST
TECHNIQUE: Multidetector CT imaging of the abdomen and pelvis was performed
following the standard protocol without IV contrast.

[Series 2: standard/full over (age)lbs 5.0 · axial · 0.67mm/px · z∈[+626,+996]mm · 13 of 85 slices shown, 15 images]
[im 7/85  soft-tissue]
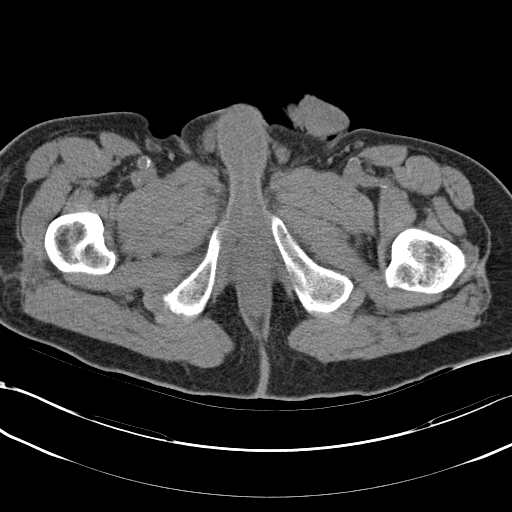
[im 7/85  bone]
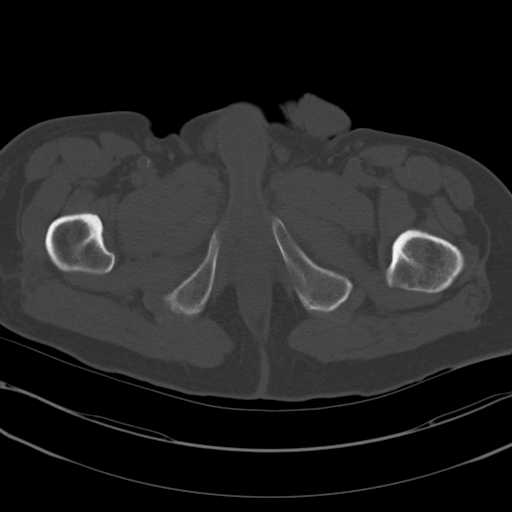
[im 13/85  soft-tissue]
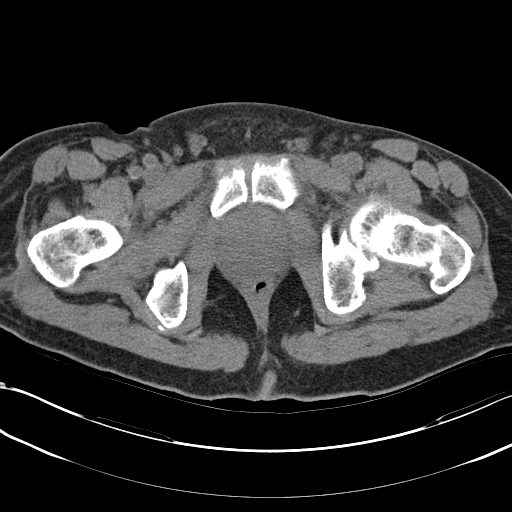
[im 19/85  soft-tissue]
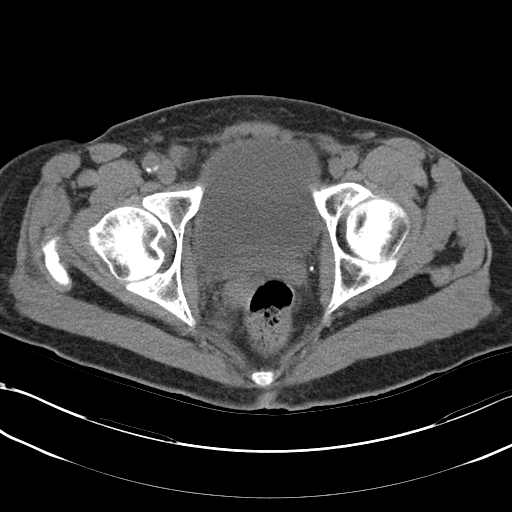
[im 25/85  soft-tissue]
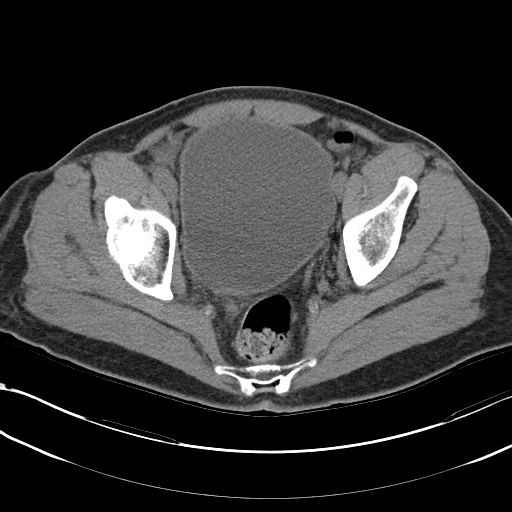
[im 32/85  soft-tissue]
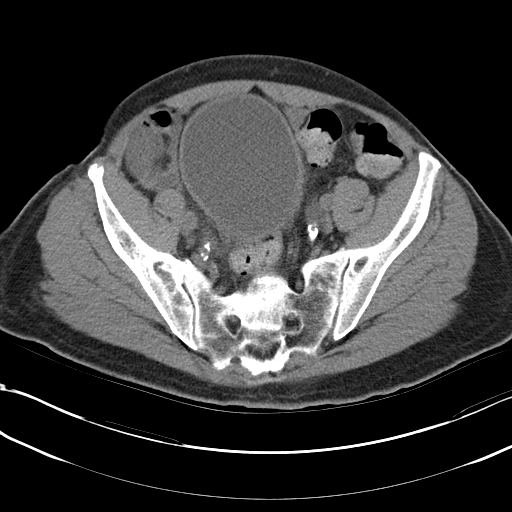
[im 38/85  soft-tissue]
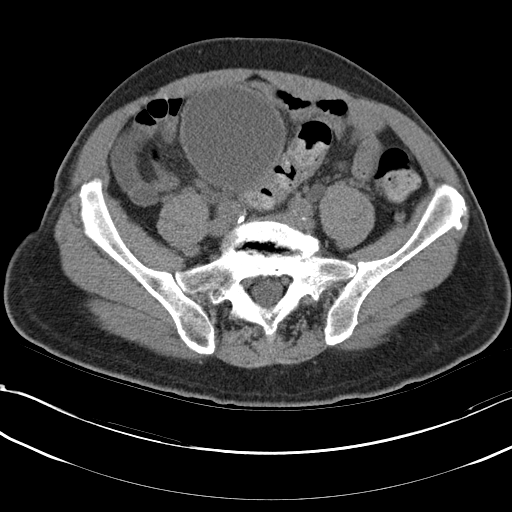
[im 44/85  soft-tissue]
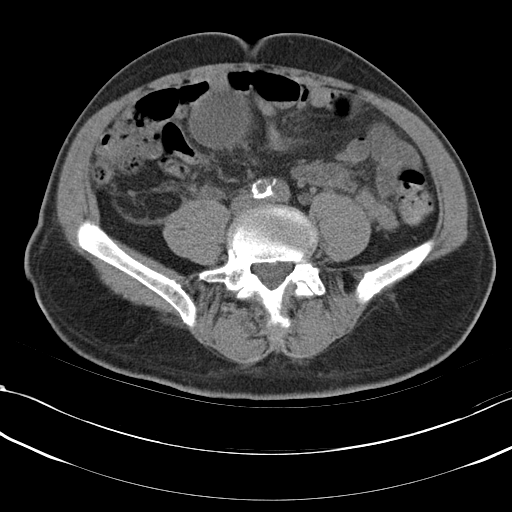
[im 50/85  soft-tissue]
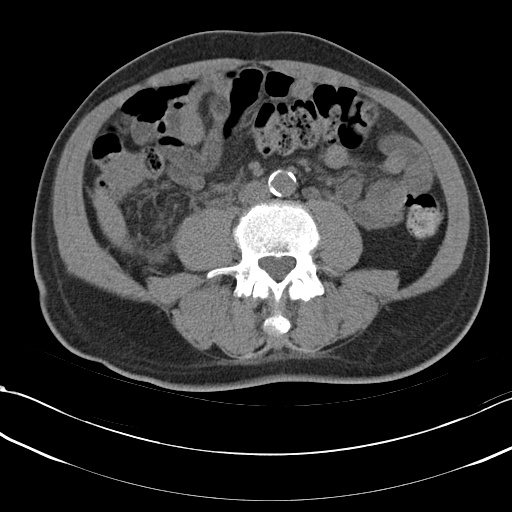
[im 57/85  soft-tissue]
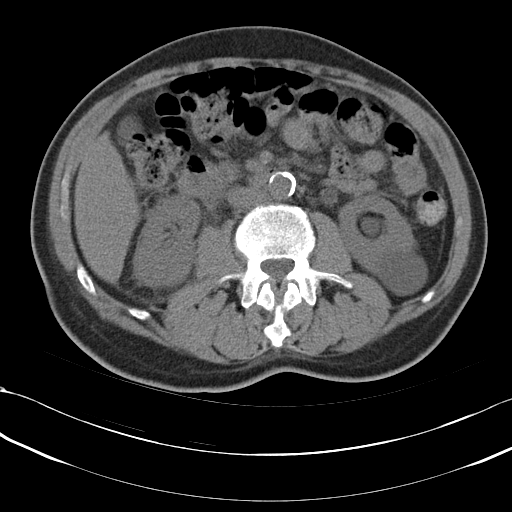
[im 57/85  bone]
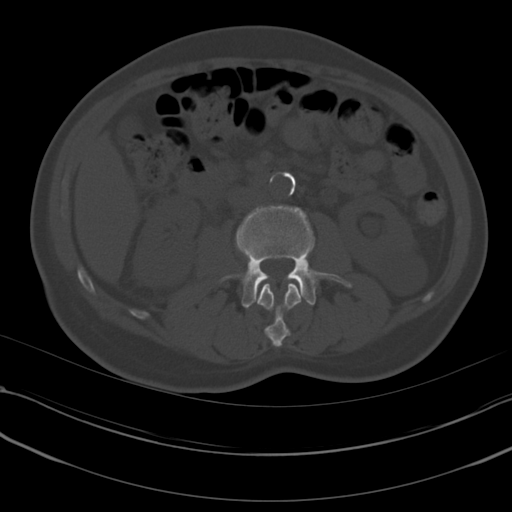
[im 63/85  soft-tissue]
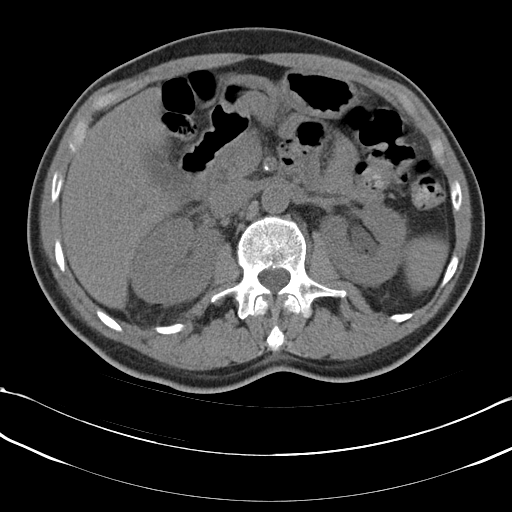
[im 69/85  soft-tissue]
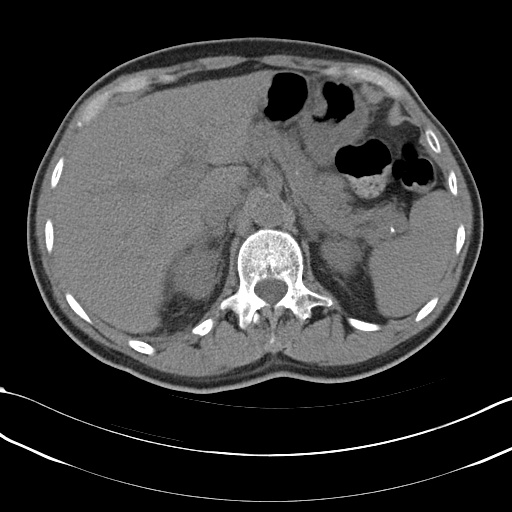
[im 75/85  soft-tissue]
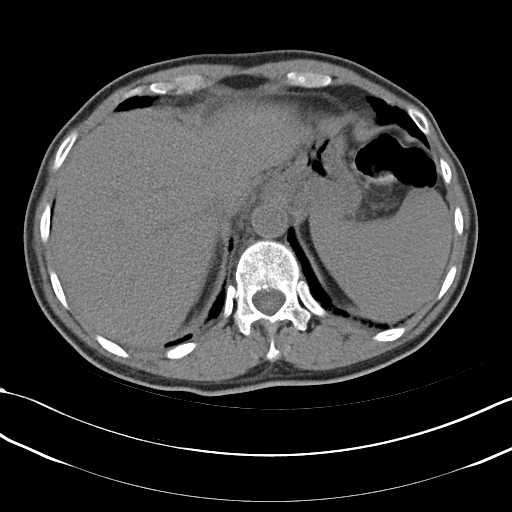
[im 81/85  soft-tissue]
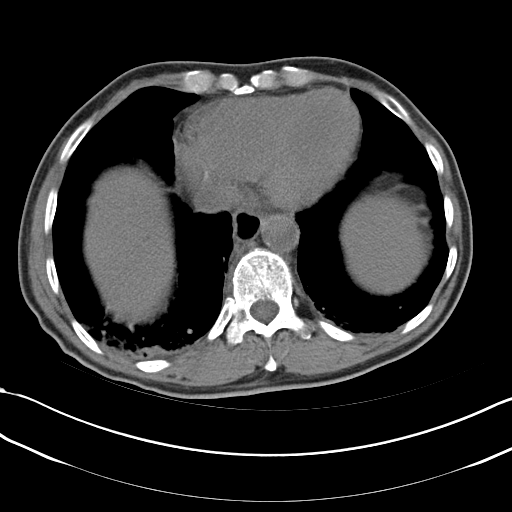

[Series 4: mpr coronal · coronal · 0.69mm/px · 3 of 81 slices shown]
[im 27/81  soft-tissue]
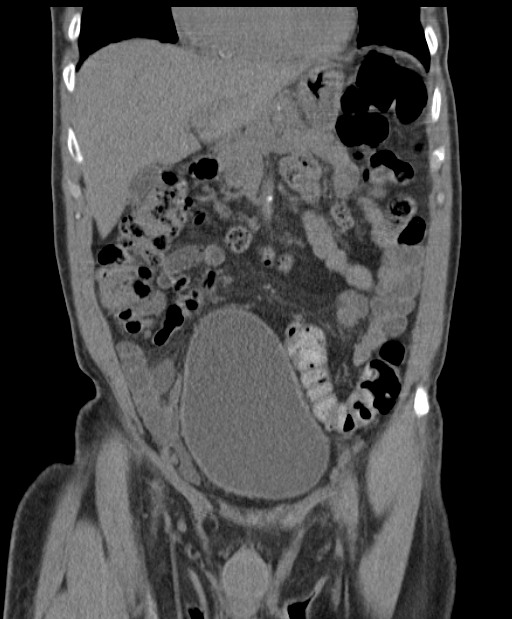
[im 36/81  soft-tissue]
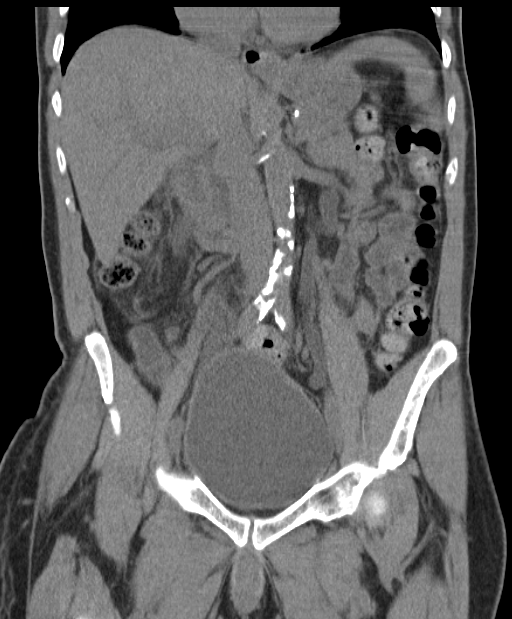
[im 45/81  soft-tissue]
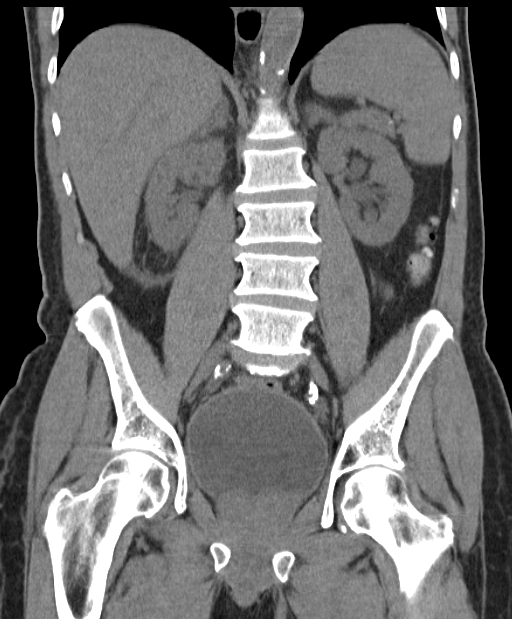

[16 of 46 positions shown; findings below may reference images not displayed]

FINDINGS: Lower chest: Normal heart size. There are consolidative opacities
within the bilateral lower lobes. No pleural effusion.

Hepatobiliary: Liver is normal in size and contour. Liver is
diffusely low in attenuation. Gallbladder is unremarkable.

Pancreas: Unremarkable

Spleen: Unremarkable

Adrenals/Urinary Tract: Mild bilateral adrenal thickening. The
urinary bladder is markedly distended. There is mild bilateral
hydroureteronephrosis. There is a 3.3 cm cyst off the inferior pole
of the left kidney. There is a 1.7 cm cyst within the inferior pole
of the right kidney. Extensive fat stranding about the right kidney
and proximal right ureter. Mild stranding about the left kidney.

Stomach/Bowel: No abnormal bowel wall thickening or evidence for
bowel obstruction. No free intraperitoneal air. Small hiatal hernia.

Vascular/Lymphatic: Normal caliber abdominal aorta. Peripheral
calcified atherosclerotic plaque. No retroperitoneal
lymphadenopathy.

Other: Prostate enlarged.

Musculoskeletal: Lumbar spine degenerative changes. No aggressive or
acute appearing osseous lesions.
IMPRESSION: There is mild bilateral hydronephrosis, left-greater-than-right.

There is extensive fat stranding about the right kidney and proximal
right ureter which is nonspecific in etiology however may be
secondary to pyelonephritis in the appropriate clinical setting.

The urinary bladder is massively distended. Recommend decompression
with Foley catheter.

## 2018-02-08 DIAGNOSIS — Z79899 Other long term (current) drug therapy: Secondary | ICD-10-CM | POA: Diagnosis not present

## 2018-02-08 DIAGNOSIS — Z1331 Encounter for screening for depression: Secondary | ICD-10-CM | POA: Diagnosis not present

## 2018-02-08 DIAGNOSIS — Z125 Encounter for screening for malignant neoplasm of prostate: Secondary | ICD-10-CM | POA: Diagnosis not present

## 2018-02-08 DIAGNOSIS — Z1211 Encounter for screening for malignant neoplasm of colon: Secondary | ICD-10-CM | POA: Diagnosis not present

## 2018-02-08 DIAGNOSIS — E7800 Pure hypercholesterolemia, unspecified: Secondary | ICD-10-CM | POA: Diagnosis not present

## 2018-02-08 DIAGNOSIS — I10 Essential (primary) hypertension: Secondary | ICD-10-CM | POA: Diagnosis not present

## 2018-02-08 DIAGNOSIS — R5383 Other fatigue: Secondary | ICD-10-CM | POA: Diagnosis not present

## 2018-02-08 DIAGNOSIS — Z1339 Encounter for screening examination for other mental health and behavioral disorders: Secondary | ICD-10-CM | POA: Diagnosis not present

## 2018-02-08 DIAGNOSIS — Z7189 Other specified counseling: Secondary | ICD-10-CM | POA: Diagnosis not present

## 2018-02-08 DIAGNOSIS — Z681 Body mass index (BMI) 19 or less, adult: Secondary | ICD-10-CM | POA: Diagnosis not present

## 2018-02-08 DIAGNOSIS — Z789 Other specified health status: Secondary | ICD-10-CM | POA: Diagnosis not present

## 2018-02-08 DIAGNOSIS — Z0000 Encounter for general adult medical examination without abnormal findings: Secondary | ICD-10-CM | POA: Diagnosis not present

## 2018-02-08 DIAGNOSIS — Z299 Encounter for prophylactic measures, unspecified: Secondary | ICD-10-CM | POA: Diagnosis not present

## 2018-02-09 DIAGNOSIS — H5461 Unqualified visual loss, right eye, normal vision left eye: Secondary | ICD-10-CM | POA: Diagnosis not present

## 2018-02-09 DIAGNOSIS — N400 Enlarged prostate without lower urinary tract symptoms: Secondary | ICD-10-CM | POA: Diagnosis not present

## 2018-02-09 DIAGNOSIS — H269 Unspecified cataract: Secondary | ICD-10-CM | POA: Diagnosis not present

## 2018-02-09 DIAGNOSIS — Z681 Body mass index (BMI) 19 or less, adult: Secondary | ICD-10-CM | POA: Diagnosis not present

## 2018-02-09 DIAGNOSIS — R413 Other amnesia: Secondary | ICD-10-CM | POA: Diagnosis not present

## 2018-02-09 DIAGNOSIS — G4700 Insomnia, unspecified: Secondary | ICD-10-CM | POA: Diagnosis not present

## 2018-02-09 DIAGNOSIS — H409 Unspecified glaucoma: Secondary | ICD-10-CM | POA: Diagnosis not present

## 2018-02-09 DIAGNOSIS — H547 Unspecified visual loss: Secondary | ICD-10-CM | POA: Diagnosis not present

## 2018-02-09 DIAGNOSIS — I10 Essential (primary) hypertension: Secondary | ICD-10-CM | POA: Diagnosis not present

## 2018-02-10 DIAGNOSIS — G4733 Obstructive sleep apnea (adult) (pediatric): Secondary | ICD-10-CM | POA: Diagnosis not present

## 2018-04-02 DIAGNOSIS — Z299 Encounter for prophylactic measures, unspecified: Secondary | ICD-10-CM | POA: Diagnosis not present

## 2018-04-02 DIAGNOSIS — I10 Essential (primary) hypertension: Secondary | ICD-10-CM | POA: Diagnosis not present

## 2018-04-02 DIAGNOSIS — F0280 Dementia in other diseases classified elsewhere without behavioral disturbance: Secondary | ICD-10-CM | POA: Diagnosis not present

## 2018-04-02 DIAGNOSIS — G309 Alzheimer's disease, unspecified: Secondary | ICD-10-CM | POA: Diagnosis not present

## 2018-04-02 DIAGNOSIS — R410 Disorientation, unspecified: Secondary | ICD-10-CM | POA: Diagnosis not present

## 2018-04-02 DIAGNOSIS — Z681 Body mass index (BMI) 19 or less, adult: Secondary | ICD-10-CM | POA: Diagnosis not present

## 2018-06-08 DIAGNOSIS — G309 Alzheimer's disease, unspecified: Secondary | ICD-10-CM | POA: Diagnosis not present

## 2018-06-08 DIAGNOSIS — Z681 Body mass index (BMI) 19 or less, adult: Secondary | ICD-10-CM | POA: Diagnosis not present

## 2018-06-08 DIAGNOSIS — I10 Essential (primary) hypertension: Secondary | ICD-10-CM | POA: Diagnosis not present

## 2018-06-08 DIAGNOSIS — Z299 Encounter for prophylactic measures, unspecified: Secondary | ICD-10-CM | POA: Diagnosis not present

## 2018-06-08 DIAGNOSIS — F0280 Dementia in other diseases classified elsewhere without behavioral disturbance: Secondary | ICD-10-CM | POA: Diagnosis not present

## 2018-06-08 DIAGNOSIS — G500 Trigeminal neuralgia: Secondary | ICD-10-CM | POA: Diagnosis not present

## 2018-09-08 DIAGNOSIS — I10 Essential (primary) hypertension: Secondary | ICD-10-CM | POA: Diagnosis not present

## 2018-09-08 DIAGNOSIS — Z299 Encounter for prophylactic measures, unspecified: Secondary | ICD-10-CM | POA: Diagnosis not present

## 2018-09-08 DIAGNOSIS — Z681 Body mass index (BMI) 19 or less, adult: Secondary | ICD-10-CM | POA: Diagnosis not present

## 2018-09-08 DIAGNOSIS — F322 Major depressive disorder, single episode, severe without psychotic features: Secondary | ICD-10-CM | POA: Diagnosis not present

## 2018-09-08 DIAGNOSIS — G309 Alzheimer's disease, unspecified: Secondary | ICD-10-CM | POA: Diagnosis not present

## 2018-09-08 DIAGNOSIS — F0280 Dementia in other diseases classified elsewhere without behavioral disturbance: Secondary | ICD-10-CM | POA: Diagnosis not present

## 2018-10-26 DIAGNOSIS — Z299 Encounter for prophylactic measures, unspecified: Secondary | ICD-10-CM | POA: Diagnosis not present

## 2018-10-26 DIAGNOSIS — F0280 Dementia in other diseases classified elsewhere without behavioral disturbance: Secondary | ICD-10-CM | POA: Diagnosis not present

## 2018-10-26 DIAGNOSIS — N390 Urinary tract infection, site not specified: Secondary | ICD-10-CM | POA: Diagnosis not present

## 2018-10-26 DIAGNOSIS — G309 Alzheimer's disease, unspecified: Secondary | ICD-10-CM | POA: Diagnosis not present

## 2018-10-26 DIAGNOSIS — Z681 Body mass index (BMI) 19 or less, adult: Secondary | ICD-10-CM | POA: Diagnosis not present

## 2018-10-26 DIAGNOSIS — R350 Frequency of micturition: Secondary | ICD-10-CM | POA: Diagnosis not present

## 2018-11-26 DIAGNOSIS — G4700 Insomnia, unspecified: Secondary | ICD-10-CM | POA: Diagnosis not present

## 2018-11-26 DIAGNOSIS — H269 Unspecified cataract: Secondary | ICD-10-CM | POA: Diagnosis not present

## 2018-11-26 DIAGNOSIS — H409 Unspecified glaucoma: Secondary | ICD-10-CM | POA: Diagnosis not present

## 2018-11-26 DIAGNOSIS — G500 Trigeminal neuralgia: Secondary | ICD-10-CM | POA: Diagnosis not present

## 2018-11-26 DIAGNOSIS — I10 Essential (primary) hypertension: Secondary | ICD-10-CM | POA: Diagnosis not present

## 2018-11-26 DIAGNOSIS — N400 Enlarged prostate without lower urinary tract symptoms: Secondary | ICD-10-CM | POA: Diagnosis not present

## 2018-11-26 DIAGNOSIS — F324 Major depressive disorder, single episode, in partial remission: Secondary | ICD-10-CM | POA: Diagnosis not present

## 2018-11-26 DIAGNOSIS — G4733 Obstructive sleep apnea (adult) (pediatric): Secondary | ICD-10-CM | POA: Diagnosis not present

## 2018-11-26 DIAGNOSIS — H547 Unspecified visual loss: Secondary | ICD-10-CM | POA: Diagnosis not present

## 2018-12-27 DIAGNOSIS — Z299 Encounter for prophylactic measures, unspecified: Secondary | ICD-10-CM | POA: Diagnosis not present

## 2018-12-27 DIAGNOSIS — Z681 Body mass index (BMI) 19 or less, adult: Secondary | ICD-10-CM | POA: Diagnosis not present

## 2018-12-27 DIAGNOSIS — F0280 Dementia in other diseases classified elsewhere without behavioral disturbance: Secondary | ICD-10-CM | POA: Diagnosis not present

## 2018-12-27 DIAGNOSIS — I10 Essential (primary) hypertension: Secondary | ICD-10-CM | POA: Diagnosis not present

## 2018-12-27 DIAGNOSIS — F322 Major depressive disorder, single episode, severe without psychotic features: Secondary | ICD-10-CM | POA: Diagnosis not present

## 2018-12-27 DIAGNOSIS — G309 Alzheimer's disease, unspecified: Secondary | ICD-10-CM | POA: Diagnosis not present

## 2019-02-14 DIAGNOSIS — E7800 Pure hypercholesterolemia, unspecified: Secondary | ICD-10-CM | POA: Diagnosis not present

## 2019-02-14 DIAGNOSIS — Z681 Body mass index (BMI) 19 or less, adult: Secondary | ICD-10-CM | POA: Diagnosis not present

## 2019-02-14 DIAGNOSIS — R5383 Other fatigue: Secondary | ICD-10-CM | POA: Diagnosis not present

## 2019-02-14 DIAGNOSIS — Z1339 Encounter for screening examination for other mental health and behavioral disorders: Secondary | ICD-10-CM | POA: Diagnosis not present

## 2019-02-14 DIAGNOSIS — Z7189 Other specified counseling: Secondary | ICD-10-CM | POA: Diagnosis not present

## 2019-02-14 DIAGNOSIS — Z0000 Encounter for general adult medical examination without abnormal findings: Secondary | ICD-10-CM | POA: Diagnosis not present

## 2019-02-14 DIAGNOSIS — Z1331 Encounter for screening for depression: Secondary | ICD-10-CM | POA: Diagnosis not present

## 2019-02-14 DIAGNOSIS — Z299 Encounter for prophylactic measures, unspecified: Secondary | ICD-10-CM | POA: Diagnosis not present

## 2019-02-14 DIAGNOSIS — I10 Essential (primary) hypertension: Secondary | ICD-10-CM | POA: Diagnosis not present

## 2019-02-14 DIAGNOSIS — Z79899 Other long term (current) drug therapy: Secondary | ICD-10-CM | POA: Diagnosis not present

## 2019-02-14 DIAGNOSIS — Z125 Encounter for screening for malignant neoplasm of prostate: Secondary | ICD-10-CM | POA: Diagnosis not present

## 2019-04-05 DIAGNOSIS — G309 Alzheimer's disease, unspecified: Secondary | ICD-10-CM | POA: Diagnosis not present

## 2019-04-05 DIAGNOSIS — F0280 Dementia in other diseases classified elsewhere without behavioral disturbance: Secondary | ICD-10-CM | POA: Diagnosis not present

## 2019-04-05 DIAGNOSIS — Z681 Body mass index (BMI) 19 or less, adult: Secondary | ICD-10-CM | POA: Diagnosis not present

## 2019-04-05 DIAGNOSIS — I10 Essential (primary) hypertension: Secondary | ICD-10-CM | POA: Diagnosis not present

## 2019-04-05 DIAGNOSIS — R350 Frequency of micturition: Secondary | ICD-10-CM | POA: Diagnosis not present

## 2019-04-05 DIAGNOSIS — Z299 Encounter for prophylactic measures, unspecified: Secondary | ICD-10-CM | POA: Diagnosis not present

## 2019-11-16 DIAGNOSIS — G309 Alzheimer's disease, unspecified: Secondary | ICD-10-CM | POA: Diagnosis not present

## 2019-11-16 DIAGNOSIS — F322 Major depressive disorder, single episode, severe without psychotic features: Secondary | ICD-10-CM | POA: Diagnosis not present

## 2019-11-23 DIAGNOSIS — F418 Other specified anxiety disorders: Secondary | ICD-10-CM | POA: Diagnosis not present

## 2019-11-23 DIAGNOSIS — F0280 Dementia in other diseases classified elsewhere without behavioral disturbance: Secondary | ICD-10-CM | POA: Diagnosis not present

## 2019-11-23 DIAGNOSIS — Z87891 Personal history of nicotine dependence: Secondary | ICD-10-CM | POA: Diagnosis not present

## 2019-11-23 DIAGNOSIS — M545 Low back pain: Secondary | ICD-10-CM | POA: Diagnosis not present

## 2019-11-23 DIAGNOSIS — G309 Alzheimer's disease, unspecified: Secondary | ICD-10-CM | POA: Diagnosis not present

## 2019-11-23 DIAGNOSIS — R32 Unspecified urinary incontinence: Secondary | ICD-10-CM | POA: Diagnosis not present

## 2019-11-23 DIAGNOSIS — G500 Trigeminal neuralgia: Secondary | ICD-10-CM | POA: Diagnosis not present

## 2019-11-23 DIAGNOSIS — Z681 Body mass index (BMI) 19 or less, adult: Secondary | ICD-10-CM | POA: Diagnosis not present

## 2019-11-23 DIAGNOSIS — G4700 Insomnia, unspecified: Secondary | ICD-10-CM | POA: Diagnosis not present

## 2019-11-23 DIAGNOSIS — H269 Unspecified cataract: Secondary | ICD-10-CM | POA: Diagnosis not present

## 2019-11-23 DIAGNOSIS — R531 Weakness: Secondary | ICD-10-CM | POA: Diagnosis not present

## 2019-11-23 DIAGNOSIS — F325 Major depressive disorder, single episode, in full remission: Secondary | ICD-10-CM | POA: Diagnosis not present

## 2019-11-23 DIAGNOSIS — B351 Tinea unguium: Secondary | ICD-10-CM | POA: Diagnosis not present

## 2019-11-23 DIAGNOSIS — G4733 Obstructive sleep apnea (adult) (pediatric): Secondary | ICD-10-CM | POA: Diagnosis not present

## 2019-11-23 DIAGNOSIS — I739 Peripheral vascular disease, unspecified: Secondary | ICD-10-CM | POA: Diagnosis not present

## 2019-11-23 DIAGNOSIS — N400 Enlarged prostate without lower urinary tract symptoms: Secondary | ICD-10-CM | POA: Diagnosis not present

## 2019-12-16 DIAGNOSIS — G4700 Insomnia, unspecified: Secondary | ICD-10-CM | POA: Diagnosis not present

## 2019-12-16 DIAGNOSIS — I10 Essential (primary) hypertension: Secondary | ICD-10-CM | POA: Diagnosis not present

## 2020-01-16 DIAGNOSIS — F0390 Unspecified dementia without behavioral disturbance: Secondary | ICD-10-CM | POA: Diagnosis not present

## 2020-01-16 DIAGNOSIS — R031 Nonspecific low blood-pressure reading: Secondary | ICD-10-CM | POA: Diagnosis not present

## 2020-01-16 DIAGNOSIS — G309 Alzheimer's disease, unspecified: Secondary | ICD-10-CM | POA: Diagnosis not present

## 2020-01-16 DIAGNOSIS — R519 Headache, unspecified: Secondary | ICD-10-CM | POA: Diagnosis not present

## 2020-01-16 DIAGNOSIS — I959 Hypotension, unspecified: Secondary | ICD-10-CM | POA: Diagnosis not present

## 2020-01-16 DIAGNOSIS — M545 Low back pain: Secondary | ICD-10-CM | POA: Diagnosis not present

## 2020-01-19 DIAGNOSIS — Z299 Encounter for prophylactic measures, unspecified: Secondary | ICD-10-CM | POA: Diagnosis not present

## 2020-01-19 DIAGNOSIS — I959 Hypotension, unspecified: Secondary | ICD-10-CM | POA: Diagnosis not present

## 2020-01-19 DIAGNOSIS — F0280 Dementia in other diseases classified elsewhere without behavioral disturbance: Secondary | ICD-10-CM | POA: Diagnosis not present

## 2020-01-19 DIAGNOSIS — G309 Alzheimer's disease, unspecified: Secondary | ICD-10-CM | POA: Diagnosis not present

## 2020-02-15 DIAGNOSIS — I959 Hypotension, unspecified: Secondary | ICD-10-CM | POA: Diagnosis not present

## 2020-02-15 DIAGNOSIS — G309 Alzheimer's disease, unspecified: Secondary | ICD-10-CM | POA: Diagnosis not present

## 2020-02-27 DIAGNOSIS — I10 Essential (primary) hypertension: Secondary | ICD-10-CM | POA: Diagnosis not present

## 2020-02-27 DIAGNOSIS — Z1339 Encounter for screening examination for other mental health and behavioral disorders: Secondary | ICD-10-CM | POA: Diagnosis not present

## 2020-02-27 DIAGNOSIS — Z125 Encounter for screening for malignant neoplasm of prostate: Secondary | ICD-10-CM | POA: Diagnosis not present

## 2020-02-27 DIAGNOSIS — Z681 Body mass index (BMI) 19 or less, adult: Secondary | ICD-10-CM | POA: Diagnosis not present

## 2020-02-27 DIAGNOSIS — Z79899 Other long term (current) drug therapy: Secondary | ICD-10-CM | POA: Diagnosis not present

## 2020-02-27 DIAGNOSIS — Z0000 Encounter for general adult medical examination without abnormal findings: Secondary | ICD-10-CM | POA: Diagnosis not present

## 2020-02-27 DIAGNOSIS — Z7189 Other specified counseling: Secondary | ICD-10-CM | POA: Diagnosis not present

## 2020-02-27 DIAGNOSIS — R5383 Other fatigue: Secondary | ICD-10-CM | POA: Diagnosis not present

## 2020-02-27 DIAGNOSIS — Z299 Encounter for prophylactic measures, unspecified: Secondary | ICD-10-CM | POA: Diagnosis not present

## 2020-02-27 DIAGNOSIS — E7800 Pure hypercholesterolemia, unspecified: Secondary | ICD-10-CM | POA: Diagnosis not present

## 2020-02-27 DIAGNOSIS — Z1331 Encounter for screening for depression: Secondary | ICD-10-CM | POA: Diagnosis not present

## 2020-04-17 DIAGNOSIS — G309 Alzheimer's disease, unspecified: Secondary | ICD-10-CM | POA: Diagnosis not present

## 2020-04-17 DIAGNOSIS — I959 Hypotension, unspecified: Secondary | ICD-10-CM | POA: Diagnosis not present

## 2020-05-17 DIAGNOSIS — G309 Alzheimer's disease, unspecified: Secondary | ICD-10-CM | POA: Diagnosis not present

## 2020-05-17 DIAGNOSIS — I959 Hypotension, unspecified: Secondary | ICD-10-CM | POA: Diagnosis not present

## 2020-06-15 DIAGNOSIS — G309 Alzheimer's disease, unspecified: Secondary | ICD-10-CM | POA: Diagnosis not present

## 2020-06-15 DIAGNOSIS — I959 Hypotension, unspecified: Secondary | ICD-10-CM | POA: Diagnosis not present

## 2020-06-18 DIAGNOSIS — N179 Acute kidney failure, unspecified: Secondary | ICD-10-CM | POA: Diagnosis not present

## 2020-06-18 DIAGNOSIS — F0390 Unspecified dementia without behavioral disturbance: Secondary | ICD-10-CM | POA: Diagnosis not present

## 2020-06-18 DIAGNOSIS — R1310 Dysphagia, unspecified: Secondary | ICD-10-CM | POA: Diagnosis not present

## 2020-06-18 DIAGNOSIS — Z681 Body mass index (BMI) 19 or less, adult: Secondary | ICD-10-CM | POA: Diagnosis not present

## 2020-06-18 DIAGNOSIS — L89159 Pressure ulcer of sacral region, unspecified stage: Secondary | ICD-10-CM | POA: Diagnosis not present

## 2020-06-18 DIAGNOSIS — J189 Pneumonia, unspecified organism: Secondary | ICD-10-CM | POA: Diagnosis not present

## 2020-06-18 DIAGNOSIS — J1282 Pneumonia due to coronavirus disease 2019: Secondary | ICD-10-CM | POA: Diagnosis not present

## 2020-06-18 DIAGNOSIS — I10 Essential (primary) hypertension: Secondary | ICD-10-CM | POA: Diagnosis not present

## 2020-06-18 DIAGNOSIS — E440 Moderate protein-calorie malnutrition: Secondary | ICD-10-CM | POA: Diagnosis not present

## 2020-06-18 DIAGNOSIS — R140 Abdominal distension (gaseous): Secondary | ICD-10-CM | POA: Diagnosis not present

## 2020-06-18 DIAGNOSIS — E871 Hypo-osmolality and hyponatremia: Secondary | ICD-10-CM | POA: Diagnosis not present

## 2020-06-18 DIAGNOSIS — U071 COVID-19: Secondary | ICD-10-CM | POA: Diagnosis not present

## 2020-06-18 DIAGNOSIS — E785 Hyperlipidemia, unspecified: Secondary | ICD-10-CM | POA: Diagnosis not present

## 2020-06-18 DIAGNOSIS — T17900A Unspecified foreign body in respiratory tract, part unspecified causing asphyxiation, initial encounter: Secondary | ICD-10-CM | POA: Diagnosis not present

## 2020-06-18 DIAGNOSIS — Z4659 Encounter for fitting and adjustment of other gastrointestinal appliance and device: Secondary | ICD-10-CM | POA: Diagnosis not present

## 2020-06-18 DIAGNOSIS — E46 Unspecified protein-calorie malnutrition: Secondary | ICD-10-CM | POA: Diagnosis not present

## 2020-06-18 DIAGNOSIS — R0902 Hypoxemia: Secondary | ICD-10-CM | POA: Diagnosis not present

## 2020-06-18 DIAGNOSIS — R4182 Altered mental status, unspecified: Secondary | ICD-10-CM | POA: Diagnosis not present

## 2020-06-18 DIAGNOSIS — R627 Adult failure to thrive: Secondary | ICD-10-CM | POA: Diagnosis not present

## 2020-06-18 DIAGNOSIS — J9601 Acute respiratory failure with hypoxia: Secondary | ICD-10-CM | POA: Diagnosis not present

## 2020-06-18 DIAGNOSIS — J90 Pleural effusion, not elsewhere classified: Secondary | ICD-10-CM | POA: Diagnosis not present

## 2020-06-18 DIAGNOSIS — R509 Fever, unspecified: Secondary | ICD-10-CM | POA: Diagnosis not present

## 2020-06-18 DIAGNOSIS — T17908A Unspecified foreign body in respiratory tract, part unspecified causing other injury, initial encounter: Secondary | ICD-10-CM | POA: Diagnosis not present

## 2020-06-18 DIAGNOSIS — R918 Other nonspecific abnormal finding of lung field: Secondary | ICD-10-CM | POA: Diagnosis not present

## 2020-06-18 DIAGNOSIS — A419 Sepsis, unspecified organism: Secondary | ICD-10-CM | POA: Diagnosis not present

## 2020-07-18 DEATH — deceased
